# Patient Record
Sex: Male | Born: 1985
Health system: Southern US, Community
[De-identification: ages and names within clinical notes are randomized; demographics above are authoritative.]

## PROBLEM LIST (undated history)

## (undated) DIAGNOSIS — I10 Essential (primary) hypertension: Secondary | ICD-10-CM

## (undated) DIAGNOSIS — E119 Type 2 diabetes mellitus without complications: Secondary | ICD-10-CM

## (undated) DIAGNOSIS — M549 Dorsalgia, unspecified: Secondary | ICD-10-CM

## (undated) HISTORY — PX: HAND SURGERY: SHX662

---

## 2006-09-02 ENCOUNTER — Emergency Department (HOSPITAL_COMMUNITY): Admission: EM | Admit: 2006-09-02 | Discharge: 2006-09-02 | Payer: Self-pay | Admitting: Emergency Medicine

## 2006-09-16 ENCOUNTER — Emergency Department (HOSPITAL_COMMUNITY): Admission: EM | Admit: 2006-09-16 | Discharge: 2006-09-17 | Payer: Self-pay | Admitting: Emergency Medicine

## 2012-06-06 ENCOUNTER — Encounter (HOSPITAL_COMMUNITY): Payer: Self-pay | Admitting: *Deleted

## 2012-06-06 ENCOUNTER — Emergency Department (HOSPITAL_COMMUNITY)
Admission: EM | Admit: 2012-06-06 | Discharge: 2012-06-06 | Disposition: A | Discharge status: Home / Self Care | Payer: Self-pay | Attending: Emergency Medicine | Admitting: Emergency Medicine

## 2012-06-06 DIAGNOSIS — Z8739 Personal history of other diseases of the musculoskeletal system and connective tissue: Secondary | ICD-10-CM | POA: Insufficient documentation

## 2012-06-06 DIAGNOSIS — I10 Essential (primary) hypertension: Secondary | ICD-10-CM | POA: Insufficient documentation

## 2012-06-06 DIAGNOSIS — B853 Phthiriasis: Secondary | ICD-10-CM | POA: Insufficient documentation

## 2012-06-06 DIAGNOSIS — F17200 Nicotine dependence, unspecified, uncomplicated: Secondary | ICD-10-CM | POA: Insufficient documentation

## 2012-06-06 HISTORY — DX: Essential (primary) hypertension: I10

## 2012-06-06 HISTORY — DX: Dorsalgia, unspecified: M54.9

## 2012-06-06 MED ORDER — PERMETHRIN 0.5 % AERO: INHALATION_SPRAY | Freq: Once | Status: DC

## 2012-06-06 NOTE — ED Provider Notes (Signed)
Medical screening examination/treatment/procedure(s) were performed by non-physician practitioner and as supervising physician I was immediately available for consultation/collaboration.    Sandeep Radell R Marylu Dudenhoeffer, MD 06/06/12 1558 

## 2012-06-06 NOTE — ED Provider Notes (Signed)
History     CSN: [id number]  Arrival date & time 06/06/12  1244   First MD Initiated Contact with Patient 06/06/12 1459      Chief Complaint  Patient presents with  . SEXUALLY TRANSMITTED DISEASE    (Consider location/radiation/quality/duration/timing/severity/associated sxs/prior treatment) HPI Patient presents emergency department with itching of his penis and scrotal area.  Patient, states he had sexual intercourse with his ex-wife in it started 2 days after this encounter.  Patient denies penile discharge, fever, dysuria, nausea, vomiting, or, abdominal pain.  Patient, states he did not take any medications prior to arrival for his symptoms.  Patient, states, that he's never had any sexually transmitted diseases. Past Medical History  Diagnosis Date  . Hypertension   . Back pain     History reviewed. No pertinent past surgical history.  No family history on file.  History  Substance Use Topics  . Smoking status: Current Every Day Smoker  . Smokeless tobacco: Not on file  . Alcohol Use: Yes      Review of Systems All other systems negative except as documented in the HPI. All pertinent positives and negatives as reviewed in the HPI. Allergies  Review of patient's allergies indicates no known allergies.  Home Medications  No current outpatient prescriptions on file.  BP 141/78  Pulse 80  Temp(Src) 98.5 F (36.9 C) (Oral)  Resp 16  Wt 220 lb (99.791 kg)  SpO2 100%  Physical Exam  Constitutional: He appears well-developed and well-nourished. No distress.  Neck: Normal range of motion. Neck supple.  Genitourinary:    Right testis shows no mass, no swelling and no tenderness. Left testis shows no mass, no swelling and no tenderness. Penile erythema present. No discharge found.  Skin: Skin is warm and dry.    ED Course  Procedures (including critical care time) Patient be treated for possible pubic lice.  He is advised to return here as needed.  There is  no penile discharge noted.  On exam.  The patient denies any discharge.    MDM          Carlyle Dolly, PA-C 06/06/12 1520

## 2012-06-06 NOTE — ED Notes (Signed)
Reports 2 days of itching, denies any other symptoms or urinary problems

## 2017-05-10 ENCOUNTER — Inpatient Hospital Stay (HOSPITAL_COMMUNITY)
Admission: EM | Admit: 2017-05-10 | Discharge: 2017-05-12 | DRG: 639 | Disposition: A | Discharge status: Home / Self Care | Payer: Self-pay | Attending: Internal Medicine | Admitting: Internal Medicine

## 2017-05-10 ENCOUNTER — Other Ambulatory Visit: Payer: Self-pay

## 2017-05-10 ENCOUNTER — Encounter (HOSPITAL_COMMUNITY): Payer: Self-pay | Admitting: *Deleted

## 2017-05-10 DIAGNOSIS — E875 Hyperkalemia: Secondary | ICD-10-CM | POA: Diagnosis present

## 2017-05-10 DIAGNOSIS — Z8679 Personal history of other diseases of the circulatory system: Secondary | ICD-10-CM

## 2017-05-10 DIAGNOSIS — R402413 Glasgow coma scale score 13-15, at hospital admission: Secondary | ICD-10-CM | POA: Diagnosis present

## 2017-05-10 DIAGNOSIS — R9431 Abnormal electrocardiogram [ECG] [EKG]: Secondary | ICD-10-CM | POA: Diagnosis present

## 2017-05-10 DIAGNOSIS — R945 Abnormal results of liver function studies: Secondary | ICD-10-CM

## 2017-05-10 DIAGNOSIS — D72829 Elevated white blood cell count, unspecified: Secondary | ICD-10-CM | POA: Diagnosis present

## 2017-05-10 DIAGNOSIS — R000 Tachycardia, unspecified: Secondary | ICD-10-CM | POA: Diagnosis present

## 2017-05-10 DIAGNOSIS — R7989 Other specified abnormal findings of blood chemistry: Secondary | ICD-10-CM | POA: Diagnosis present

## 2017-05-10 DIAGNOSIS — E1110 Type 2 diabetes mellitus with ketoacidosis without coma: Principal | ICD-10-CM | POA: Diagnosis present

## 2017-05-10 DIAGNOSIS — F17200 Nicotine dependence, unspecified, uncomplicated: Secondary | ICD-10-CM | POA: Diagnosis present

## 2017-05-10 DIAGNOSIS — K760 Fatty (change of) liver, not elsewhere classified: Secondary | ICD-10-CM | POA: Diagnosis present

## 2017-05-10 DIAGNOSIS — M549 Dorsalgia, unspecified: Secondary | ICD-10-CM | POA: Diagnosis present

## 2017-05-10 LAB — URINALYSIS, ROUTINE W REFLEX MICROSCOPIC
BACTERIA UA: NONE SEEN
BILIRUBIN URINE: NEGATIVE
Glucose, UA: >=500 mg/dL — AB
KETONES UR: 80 mg/dL — AB
Leukocytes, UA: NEGATIVE
NITRITE: NEGATIVE
PROTEIN: 100 mg/dL — AB
Specific Gravity, Urine: 1.027 (ref 1.005–1.030)
pH: 5 (ref 5.0–8.0)

## 2017-05-10 LAB — BLOOD GAS, VENOUS
Acid-base deficit: 17.5 mmol/L — ABNORMAL HIGH (ref 0.0–2.0)
Bicarbonate: 9.9 mmol/L — ABNORMAL LOW (ref 20.0–28.0)
O2 Saturation: 79.3 %
PATIENT TEMPERATURE: 98.6
PH VEN: 7.191 — AB (ref 7.250–7.430)
pCO2, Ven: 26.9 mmHg — ABNORMAL LOW (ref 44.0–60.0)
pO2, Ven: 50.1 mmHg — ABNORMAL HIGH (ref 32.0–45.0)

## 2017-05-10 LAB — CBC
HEMATOCRIT: 52.4 % — AB (ref 39.0–52.0)
HEMATOCRIT: 49.6 % (ref 39.0–52.0)
Hemoglobin: 17.8 g/dL — ABNORMAL HIGH (ref 13.0–17.0)
Hemoglobin: 17.4 g/dL — ABNORMAL HIGH (ref 13.0–17.0)
MCH: 30 pg (ref 26.0–34.0)
MCH: 30.3 pg (ref 26.0–34.0)
MCHC: 34 g/dL (ref 30.0–36.0)
MCHC: 35.1 g/dL (ref 30.0–36.0)
MCV: 88.4 fL (ref 78.0–100.0)
MCV: 86.4 fL (ref 78.0–100.0)
Platelets: 367 10*3/uL (ref 150–400)
Platelets: 368 10*3/uL (ref 150–400)
RBC: 5.93 MIL/uL — AB (ref 4.22–5.81)
RBC: 5.74 MIL/uL (ref 4.22–5.81)
RDW: 13.6 % (ref 11.5–15.5)
RDW: 13.7 % (ref 11.5–15.5)
WBC: 13.6 10*3/uL — ABNORMAL HIGH (ref 4.0–10.5)
WBC: 14.1 10*3/uL — ABNORMAL HIGH (ref 4.0–10.5)

## 2017-05-10 LAB — BASIC METABOLIC PANEL
ANION GAP: 15 (ref 5–15)
Anion gap: 20 — ABNORMAL HIGH (ref 5–15)
BUN: 18 mg/dL (ref 6–20)
BUN: 17 mg/dL (ref 6–20)
CHLORIDE: 109 mmol/L (ref 101–111)
CO2: 10 mmol/L — AB (ref 22–32)
CO2: 15 mmol/L — AB (ref 22–32)
Calcium: 9.1 mg/dL (ref 8.9–10.3)
Calcium: 9.2 mg/dL (ref 8.9–10.3)
Chloride: 111 mmol/L (ref 101–111)
Creatinine, Ser: 1.16 mg/dL (ref 0.61–1.24)
Creatinine, Ser: 1.02 mg/dL (ref 0.61–1.24)
GFR calc non Af Amer: >60 mL/min (ref >60)
GFR calc non Af Amer: >60 mL/min (ref >60)
Glucose, Bld: 350 mg/dL — ABNORMAL HIGH (ref 65–99)
Glucose, Bld: 256 mg/dL — ABNORMAL HIGH (ref 65–99)
Potassium: 4.2 mmol/L (ref 3.5–5.1)
Potassium: 4 mmol/L (ref 3.5–5.1)
Sodium: 139 mmol/L (ref 135–145)
Sodium: 141 mmol/L (ref 135–145)

## 2017-05-10 LAB — CBG MONITORING, ED
GLUCOSE-CAPILLARY: 551 mg/dL — AB (ref 65–99)
GLUCOSE-CAPILLARY: 349 mg/dL — AB (ref 65–99)
Glucose-Capillary: 467 mg/dL — ABNORMAL HIGH (ref 65–99)

## 2017-05-10 LAB — HEMOGLOBIN A1C
Hgb A1c MFr Bld: 13.8 % — ABNORMAL HIGH (ref 4.8–5.6)
Mean Plasma Glucose: 349.36 mg/dL

## 2017-05-10 LAB — LIPASE, BLOOD: Lipase: 94 U/L — ABNORMAL HIGH (ref 11–51)

## 2017-05-10 LAB — COMPREHENSIVE METABOLIC PANEL
ALBUMIN: 4 g/dL (ref 3.5–5.0)
ALK PHOS: 138 U/L — AB (ref 38–126)
ALT: 106 U/L — ABNORMAL HIGH (ref 17–63)
AST: 50 U/L — ABNORMAL HIGH (ref 15–41)
Anion gap: 28 — ABNORMAL HIGH (ref 5–15)
BUN: 21 mg/dL — ABNORMAL HIGH (ref 6–20)
CALCIUM: 10.3 mg/dL (ref 8.9–10.3)
CO2: 12 mmol/L — AB (ref 22–32)
Chloride: 90 mmol/L — ABNORMAL LOW (ref 101–111)
Creatinine, Ser: 1.53 mg/dL — ABNORMAL HIGH (ref 0.61–1.24)
GFR calc non Af Amer: 60 mL/min — ABNORMAL LOW (ref >60)
GLUCOSE: 807 mg/dL — AB (ref 65–99)
POTASSIUM: 5.7 mmol/L — AB (ref 3.5–5.1)
SODIUM: 130 mmol/L — AB (ref 135–145)
TOTAL PROTEIN: 7.9 g/dL (ref 6.5–8.1)
Total Bilirubin: UNDETERMINED mg/dL (ref 0.3–1.2)

## 2017-05-10 LAB — GLUCOSE, CAPILLARY
GLUCOSE-CAPILLARY: 268 mg/dL — AB (ref 65–99)
Glucose-Capillary: 320 mg/dL — ABNORMAL HIGH (ref 65–99)
Glucose-Capillary: 242 mg/dL — ABNORMAL HIGH (ref 65–99)

## 2017-05-10 LAB — MRSA PCR SCREENING: MRSA by PCR: NEGATIVE

## 2017-05-10 MED ORDER — DEXTROSE 50 % IV SOLN: 25.0000 mL | INTRAVENOUS | Status: DC | PRN

## 2017-05-10 MED ORDER — INSULIN REGULAR BOLUS VIA INFUSION
0.0000 [IU] | Freq: Three times a day (TID) | INTRAVENOUS | Status: AC
  Administered 2017-05-11: 7.3 [IU] via INTRAVENOUS
  Filled 2017-05-10: qty 10

## 2017-05-10 MED ORDER — SODIUM CHLORIDE 0.9 % IV SOLN
INTRAVENOUS | Status: DC
  Administered 2017-05-10: 19:00:00 via INTRAVENOUS

## 2017-05-10 MED ORDER — SODIUM CHLORIDE 0.9 % IV BOLUS
1000.0000 mL | Freq: Once | INTRAVENOUS | Status: AC
  Administered 2017-05-10: 1000 mL via INTRAVENOUS

## 2017-05-10 MED ORDER — DEXTROSE-NACL 5-0.45 % IV SOLN: INTRAVENOUS | Status: DC

## 2017-05-10 MED ORDER — ONDANSETRON HCL 4 MG/2ML IJ SOLN: 4.0000 mg | Freq: Once | INTRAMUSCULAR | Status: DC

## 2017-05-10 MED ORDER — DEXTROSE-NACL 5-0.45 % IV SOLN
INTRAVENOUS | Status: DC
  Administered 2017-05-10 – 2017-05-11 (×2): via INTRAVENOUS

## 2017-05-10 MED ORDER — HEPARIN SODIUM (PORCINE) 5000 UNIT/ML IJ SOLN
5000.0000 [IU] | Freq: Three times a day (TID) | INTRAMUSCULAR | Status: DC
  Administered 2017-05-10 – 2017-05-12 (×5): 5000 [IU] via SUBCUTANEOUS
  Filled 2017-05-10 (×5): qty 1

## 2017-05-10 MED ORDER — SODIUM CHLORIDE 0.9 % IV SOLN
INTRAVENOUS | Status: DC
  Administered 2017-05-11: 12.6 [IU]/h via INTRAVENOUS
  Filled 2017-05-10: qty 1

## 2017-05-10 MED ORDER — SODIUM CHLORIDE 0.9 % IV SOLN
INTRAVENOUS | Status: DC
  Administered 2017-05-10: 5.4 [IU]/h via INTRAVENOUS
  Filled 2017-05-10: qty 1

## 2017-05-10 NOTE — ED Provider Notes (Addendum)
[facility]-EMERGENCY DEPT Provider Note   CSN: [id number] Arrival date & time: 05/10/17  1155     History   Chief Complaint Chief Complaint  Patient presents with  . Emesis    HPI Robert Ballard is a 33 y.o. male.  HPI   Robert Ballard is a 32yo male with no significant past medical history who presents to the emergency department for evaluation of polydipsia, polyuria, fatigue and vomiting.  Patient reports that he has felt generally unwell for the past week now.  States that he is not able to stay hydrated and feels like his mouth is always dry.  He is been going to the bathroom almost every hour, denies dysuria or hematuria.  States that he is been feeling generally tired and weak.  Has had vomiting for the past 3 days, nonbilious.  Denies history of diabetes.  He does not see a PCP regularly.  Has been diagnosed with hypertension in the past, but does not take any antihypertensives.  He denies fever, chills, chest pain, shortness of breath, abdominal pain, diarrhea, lightheadedness or syncope.  Past Medical History:  Diagnosis Date  . Back pain   . Hypertension     There are no active problems to display for this patient.   No past surgical history on file.      Home Medications    Prior to Admission medications   Not on File    Family History No family history on file.  Social History Social History   Tobacco Use  . Smoking status: Current Every Day Smoker  Substance Use Topics  . Alcohol use: Yes  . Drug use: No     Allergies   Patient has no known allergies.   Review of Systems Review of Systems  Constitutional: Positive for fatigue. Negative for chills and fever.  HENT: Negative for congestion and rhinorrhea.   Respiratory: Negative for shortness of breath.   Cardiovascular: Negative for chest pain.  Gastrointestinal: Positive for nausea and vomiting. Negative for abdominal pain, blood in stool and  diarrhea.  Endocrine: Positive for polydipsia and polyuria.  Genitourinary: Negative for difficulty urinating, dysuria and hematuria.  Musculoskeletal: Negative for back pain.  Skin: Negative for rash.  Neurological: Positive for weakness (generally feels weak). Negative for light-headedness, numbness and headaches.  Psychiatric/Behavioral: Negative for agitation.     Physical Exam Updated Vital Signs BP (!) 167/108 (BP Location: Left Arm)   Pulse (!) 113   Temp 98.4 F (36.9 C) (Oral)   Resp 17   Ht  (1.727 m)   Wt 95.3 kg (210 lb)   SpO2 98%   BMI 31.93 kg/m   Physical Exam  Constitutional: He is oriented to person, place, and time. He appears well-developed and well-nourished. No distress.  Sitting at bedside, non-toxic.  HENT:  Head: Normocephalic and atraumatic.  Mucous membranes dry.  Eyes: Pupils are equal, round, and reactive to light. Conjunctivae are normal. Right eye exhibits no discharge. Left eye exhibits no discharge.  Neck: Normal range of motion. Neck supple.  Cardiovascular: Intact distal pulses.  No murmur heard. Tachycardic, regular rhythm.  Pulmonary/Chest: Effort normal and breath sounds normal. No stridor. No respiratory distress. He has no wheezes. He has no rales.  No respiratory distress.  No Kussmaul breathing.  Lungs clear to auscultation.   Abdominal:  Abdomen soft and nondistended.  Nontender to palpation.  Musculoskeletal: Normal range of motion.  Neurological: He is alert and oriented to person, place,  and time. Coordination normal.  Skin: Skin is warm and dry. He is not diaphoretic.  Psychiatric: He has a normal mood and affect. His behavior is normal.  Nursing note and vitals reviewed.    ED Treatments / Results  Labs (all labs ordered are listed, but only abnormal results are displayed) Labs Reviewed  LIPASE, BLOOD - Abnormal; Notable for the following components:      Result Value   Lipase 94 (*)    All other components  within normal limits  COMPREHENSIVE METABOLIC PANEL - Abnormal; Notable for the following components:   Sodium 130 (*)    Potassium 5.7 (*)    Chloride 90 (*)    CO2 12 (*)    Glucose, Bld 807 (*)    BUN 21 (*)    Creatinine, Ser 1.53 (*)    AST 50 (*)    ALT 106 (*)    Alkaline Phosphatase 138 (*)    GFR calc non Af Amer 60 (*)    Anion gap 28.0 (*)    All other components within normal limits  CBC - Abnormal; Notable for the following components:   WBC 13.6 (*)    RBC 5.93 (*)    Hemoglobin 17.8 (*)    HCT 52.4 (*)    All other components within normal limits  URINALYSIS, ROUTINE W REFLEX MICROSCOPIC - Abnormal; Notable for the following components:   Color, Urine STRAW (*)    Glucose, UA >=500 (*)    Hgb urine dipstick MODERATE (*)    Ketones, ur 80 (*)    Protein, ur 100 (*)    All other components within normal limits  BLOOD GAS, VENOUS - Abnormal; Notable for the following components:   pH, Ven 7.191 (*)    pCO2, Ven 26.9 (*)    pO2, Ven 50.1 (*)    Bicarbonate 9.9 (*)    Acid-base deficit 17.5 (*)    All other components within normal limits  CBG MONITORING, ED - Abnormal; Notable for the following components:   Glucose-Capillary >600 (*)    All other components within normal limits  CBG MONITORING, ED - Abnormal; Notable for the following components:   Glucose-Capillary 551 (*)    All other components within normal limits  CBG MONITORING, ED - Abnormal; Notable for the following components:   Glucose-Capillary 467 (*)    All other components within normal limits  HEMOGLOBIN A1C    EKG EKG Interpretation  Date/Time:  Saturday May 10 2017 12:45:42 EDT Ventricular Rate:  111 PR Interval:    QRS Duration: 83 QT Interval:  306 QTC Calculation: 416 R Axis:   54 Text Interpretation:  Sinus tachycardia Right atrial enlargement Abnormal T, consider ischemia, diffuse leads No old tracing to compare Confirmed by Lorre Nick ([id number]) on 05/10/2017 1:17:58  PM   Radiology No results found.  Procedures Procedures (including critical care time)  CRITICAL CARE Performed by: Kellie Shropshire   Total critical care time: 35 minutes  Critical care time was exclusive of separately billable procedures and treating other patients.  Critical care was necessary to treat or prevent imminent or life-threatening deterioration.  Critical care was time spent personally by me on the following activities: development of treatment plan with patient and/or surrogate as well as nursing, discussions with consultants, evaluation of patient's response to treatment, examination of patient, obtaining history from patient or surrogate, ordering and performing treatments and interventions, ordering and review of laboratory studies, ordering and review of radiographic  studies, pulse oximetry and re-evaluation of patient's condition.   Medications Ordered in ED Medications  insulin regular (NOVOLIN R,HUMULIN R) 100 Units in sodium chloride 0.9 % 100 mL (1 Units/mL) infusion (9.8 Units/hr Intravenous Rate/Dose Change 05/10/17 1550)  ondansetron (ZOFRAN) injection 4 mg (has no administration in time range)  dextrose 5 %-0.45 % sodium chloride infusion (has no administration in time range)  sodium chloride 0.9 % bolus 1,000 mL (0 mLs Intravenous Stopped 05/10/17 1432)  sodium chloride 0.9 % bolus 1,000 mL (0 mLs Intravenous Stopped 05/10/17 1432)  sodium chloride 0.9 % bolus 1,000 mL (0 mLs Intravenous Stopped 05/10/17 1502)     Initial Impression / Assessment and Plan / ED Course  I have reviewed the triage vital signs and the nursing notes.  Pertinent labs & imaging results that were available during my care of the patient were reviewed by me and considered in my medical decision making (see chart for details).    Patient without prior history of diabetes presents to the emergency department for evaluation of polydipsia, polyuria, vomiting and fatigue.  On exam,  mucous memories dry.  No abdominal tenderness.  Lungs clear to auscultation, no respiratory distress.  Lab work consistent with DKA.  Patient's glucose is 807 on arrival with anion gap of 28.  VBG reveals acidosis with pH 7.191, bicarb low 9.9 and pCO2 low 26.9. Consistent with DKA.  CMP reveals elevated creatinine 1.53, no baseline to compare to.  This is likely prerenal in the setting of dehydration.  He also has a leukocytosis with WBC count 13.6, mild elevation in liver enzymes (AST 50, ALT 106).  He also has an elevated lipase of 94.  Given no abdominal tenderness on exam, no fever do not think CT imaging is indicated at this time.  Discussed this patient with Dr. Freida Busman who also saw the patient and agrees with this plan.  Leukocytosis is likely reactive from DKA.  UA without evidence of infection, reveals ketones and glucose, consistent with DKA.  Patient given IV fluids and insulin per DKA protocol.  Discussed this patient with Hospitalist Dr. Cena Benton who will admit this patient. Patient informed.  This was a shared visit with Dr. Freida Busman who also saw the patient and agrees with the above plan and admission.    Final Clinical Impressions(s) / ED Diagnoses   Final diagnoses:  None    ED Discharge Orders    None       Kellie Shropshire, PA-C 05/10/17 1659    Kellie Shropshire, PA-C 05/10/17 1705

## 2017-05-10 NOTE — ED Provider Notes (Signed)
Medical screening examination/treatment/procedure(s) were conducted as a shared visit with non-physician practitioner(s) and myself.  I personally evaluated the patient during the encounter.  EKG Interpretation  Date/Time:  Saturday May 10 2017 12:45:42 EDT Ventricular Rate:  111 PR Interval:    QRS Duration: 83 QT Interval:  306 QTC Calculation: 416 R Axis:   54 Text Interpretation:  Sinus tachycardia Right atrial enlargement Abnormal T, consider ischemia, diffuse leads No old tracing to compare Confirmed by Lorre Nick ([id number]) on 05/10/2017 1:54:100 PM  32 year old male presents with several days of polyuria and polydipsia with associated weight loss.  Patient's lites lites show evidence of DKA.  Patient given IV fluids here as well as started on glucose stabilizer and will be admitted to the hospital   Lorre Nick, MD 05/10/17 1423

## 2017-05-10 NOTE — H&P (Signed)
History and Physical    Robert Ballard ZOX:[id number] DOB: [birth year] DOA: 05/10/2017  PCP: Patient, No Pcp Per  Patient coming from: home  Chief Complaint: nausea and dizziness  HPI: Robert Ballard is a 32 y.o. male with medical history significant of patient was generally healthy but presented to the hospital after having persistent nausea and dizziness.  Patient also reports polyuria and polydipsia.  He had no prior history of diabetes.  The problem has been persistent and was not getting better but was actually getting worse as such patient presented for further evaluation recommendations.  He denies being around any sick contacts.  Nothing he is aware of made it better or worse.  ED Course: In the ED patient was found to have elevated blood sugar levels of 807, elevated potassium level of 5.7, with leukocytosis of 13.6 and CO2 of 12.  We were subsequently consulted for management of DKA.  Review of Systems: As per HPI otherwise 10 point review of systems negative.    Past Medical History:  Diagnosis Date  . Back pain   . Hypertension     No past surgical history on file.   reports that he has been smoking.  He does not have any smokeless tobacco history on file. He reports that he drinks alcohol. He reports that he does not use drugs.  No Known Allergies  FAmily history - pt unsure of family history of diseases states he has lived her since he was 46 and would not know   Prior to Admission medications   Not on File    Physical Exam: Vitals:   05/10/17 1201 05/10/17 1207 05/10/17 1247 05/10/17 1440  BP:  (!) 159/98 (!) 167/108 (!) 172/98  Pulse:  (!) 116 (!) 113 (!) 106  Resp:  18 17   Temp:  98.4 F (36.9 C)    TempSrc:  Oral    SpO2: 97% 98% 98% 99%  Weight:  95.3 kg (210 lb)    Height:   (1.727 m)      Constitutional: NAD, calm, comfortable Vitals:   05/10/17 1201 05/10/17 1207 05/10/17 1247 05/10/17 1440  BP:  (!) 159/98 (!)  167/108 (!) 172/98  Pulse:  (!) 116 (!) 113 (!) 106  Resp:  18 17   Temp:  98.4 F (36.9 C)    TempSrc:  Oral    SpO2: 97% 98% 98% 99%  Weight:  95.3 kg (210 lb)    Height:   (1.727 m)     Eyes: PERRL, lids and conjunctivae normal ENMT: Mucous membranes are moist. Posterior pharynx clear of any exudate or lesions.Normal dentition.  Neck: normal, supple, no masses, no thyromegaly Respiratory: clear to auscultation bilaterally, no wheezing, no crackles. Normal respiratory effort. No accessory muscle use.  Cardiovascular: Regular rate and rhythm, no murmurs / rubs / gallops. No extremity edema. 2+ pedal pulses. No carotid bruits.  Abdomen: no tenderness, no masses palpated. No hepatosplenomegaly. Bowel sounds positive.  Musculoskeletal: no clubbing / cyanosis. No joint deformity upper and lower extremities. Good ROM, no contractures. Normal muscle tone.  Skin: no rashes, lesions, ulcers. No induration Neurologic: No facial asymmetry, answers questions appropriately, sensation intact,Strength 5/5 in all 4.  Psychiatric: Normal judgment and insight. Alert and oriented x 3. Normal mood.    Labs on Admission: I have personally reviewed following labs and imaging studies  CBC: Recent Labs  Lab 05/10/17 1246  WBC 13.6*  HGB 17.8*  HCT 52.4*  MCV 88.4  PLT 367   Basic Metabolic Panel: Recent Labs  Lab 05/10/17 1246  NA 130*  K 5.7*  CL 90*  CO2 12*  GLUCOSE 807*  BUN 21*  CREATININE 1.53*  CALCIUM 10.3   GFR: Estimated Creatinine Clearance: 79.1 mL/min (A) (by C-G formula based on SCr of 1.53 mg/dL (H)). Liver Function Tests: Recent Labs  Lab 05/10/17 1246  AST 50*  ALT 106*  ALKPHOS 138*  BILITOT UNABLE TO REPORT DUE TO LIPEMIC INTERFERENCE  PROT 7.9  ALBUMIN 4.0   Recent Labs  Lab 05/10/17 1246  LIPASE 94*   No results for input(s): AMMONIA in the last 168 hours. Coagulation Profile: No results for input(s): INR, PROTIME in the last 168 hours. Cardiac  Enzymes: No results for input(s): CKTOTAL, CKMB, CKMBINDEX, TROPONINI in the last 168 hours. BNP (last 3 results) No results for input(s): PROBNP in the last 8760 hours. HbA1C: No results for input(s): HGBA1C in the last 72 hours. CBG: Recent Labs  Lab 05/10/17 1241 05/10/17 1547  GLUCAP >600* 551*   Lipid Profile: No results for input(s): CHOL, HDL, LDLCALC, TRIG, CHOLHDL, LDLDIRECT in the last 72 hours. Thyroid Function Tests: No results for input(s): TSH, T4TOTAL, FREET4, T3FREE, THYROIDAB in the last 72 hours. Anemia Panel: No results for input(s): VITAMINB12, FOLATE, FERRITIN, TIBC, IRON, RETICCTPCT in the last 72 hours. Urine analysis:    Component Value Date/Time   COLORURINE STRAW (A) 05/10/2017 1246   APPEARANCEUR CLEAR 05/10/2017 1246   LABSPEC 1.027 05/10/2017 1246   PHURINE 5.0 05/10/2017 1246   GLUCOSEU >=500 (A) 05/10/2017 1246   HGBUR MODERATE (A) 05/10/2017 1246   BILIRUBINUR NEGATIVE 05/10/2017 1246   KETONESUR 80 (A) 05/10/2017 1246   PROTEINUR 100 (A) 05/10/2017 1246   NITRITE NEGATIVE 05/10/2017 1246   LEUKOCYTESUR NEGATIVE 05/10/2017 1246    Radiological Exams on Admission: No results found.  EKG: Independently reviewed.  Sinus tachycardia with T wave inversions  Assessment/Plan Active Problems:   DKA (diabetic ketoacidoses) (HCC) -Patient is new diabetic.  He was not on medications as outpatient.  As such we will obtain an hemoglobin A1c to help guide treatment moving forward. -For now we will place patient on insulin drip and transition to stepdown unit for close monitoring. -Have placed order set for glucose stabilizer -Serial BMPs to monitor acidosis  T wave inversions - Patient complains of no chest discomfort -Obtain EKG next a.m. to see if changes are secondary to metabolic abnormalities.   DVT prophylaxis: heparin Code Status: full Family Communication: Discussed with patient directly Disposition Plan: With resolution of acidosis  and safe discharge plan for treatment of diabetes with forward Consults called: none Admission status: inpatient   Penny Pia MD [facility] Pager [phone]  If 7PM-7AM, please contact night-coverage [email or web address] Password [facility]  05/10/2017, 3:56 PM

## 2017-05-10 NOTE — ED Notes (Signed)
ED TO INPATIENT HANDOFF REPORT  Name/Age/Gender Robert Ballard 32 y.o. male  Code Status   Home/SNF/Other Home  Chief Complaint vomiting  Level of Care/Admitting Diagnosis ED Disposition    ED Disposition Condition [place]: [place] [100102]  Level of Care: Stepdown [14]  Admit to SDU based on following criteria: Hemodynamic compromise or significant risk of instability:  Patient requiring short term acute titration and management of vasoactive drips, and invasive monitoring (i.e., CVP and Arterial line).  Diagnosis: DKA (diabetic ketoacidoses) [facility]) [[id number]]  Admitting Physician: Velvet Bathe [phone]  Attending Physician: Velvet Bathe [phone]  Estimated length of stay: past midnight tomorrow  Certification:: I certify this patient will need inpatient services for at least 2 midnights  PT Class (Do Not Modify): Inpatient [101]  PT Acc Code (Do Not Modify): Private [1]       Medical History Past Medical History:  Diagnosis Date  . Back pain   . Hypertension     Allergies No Known Allergies  IV Location/Drains/Wounds Patient Lines/Drains/Airways Status   Active Line/Drains/Airways    Name:   Placement date:   Placement time:   Site:   Days:   Peripheral IV 05/10/17 Left Antecubital   05/10/17    1201    Antecubital   less than 1          Labs/Imaging Results for orders placed or performed during the hospital encounter of 05/10/17 (from the past 48 hour(s))  CBG monitoring, ED     Status: Abnormal   Collection Time: 05/10/17 12:41 PM  Result Value Ref Range   Glucose-Capillary >600 (HH) 65 - 99 mg/dL  Lipase, blood     Status: Abnormal   Collection Time: 05/10/17 12:46 PM  Result Value Ref Range   Lipase 94 (H) 11 - 51 U/L    Comment: Performed at [facility], [place] [address] [place], [place] [address]  Comprehensive metabolic panel     Status: Abnormal   Collection Time:  05/10/17 12:46 PM  Result Value Ref Range   Sodium 130 (L) 135 - 145 mmol/L   Potassium 5.7 (H) 3.5 - 5.1 mmol/L   Chloride 90 (L) 101 - 111 mmol/L   CO2 12 (L) 22 - 32 mmol/L   Glucose, Bld 807 (HH) 65 - 99 mg/dL    Comment: CRITICAL RESULT CALLED TO, READ BACK BY AND VERIFIED WITH: P.DOWD RN 05/10/2017 1354 JR    BUN 21 (H) 6 - 20 mg/dL   Creatinine, Ser 1.53 (H) 0.61 - 1.24 mg/dL   Calcium 10.3 8.9 - 10.3 mg/dL   Total Protein 7.9 6.5 - 8.1 g/dL   Albumin 4.0 3.5 - 5.0 g/dL   AST 50 (H) 15 - 41 U/L   ALT 106 (H) 17 - 63 U/L   Alkaline Phosphatase 138 (H) 38 - 126 U/L   Total Bilirubin UNABLE TO REPORT DUE TO LIPEMIC INTERFERENCE 0.3 - 1.2 mg/dL    Comment: NOTIFIED P.DOWD RN 05/10/2017 1422 JR   GFR calc non Af Amer 60 (L) >60 mL/min   GFR calc Af Amer >60 >60 mL/min    Comment: (NOTE) The eGFR has been calculated using the CKD EPI equation. This calculation has not been validated in all clinical situations. eGFR's persistently <60 mL/min signify possible Chronic Kidney Disease.    Anion gap 28.0 (H) 5 - 15    Comment: Performed at [facility], [place] [address] [place],  [place] [address]  CBC     Status: Abnormal   Collection Time: 05/10/17 12:46 PM  Result Value Ref Range   WBC 13.6 (H) 4.0 - 10.5 K/uL   RBC 5.93 (H) 4.22 - 5.81 MIL/uL   Hemoglobin 17.8 (H) 13.0 - 17.0 g/dL   HCT 52.4 (H) 39.0 - 52.0 %   MCV 88.4 78.0 - 100.0 fL   MCH 30.0 26.0 - 34.0 pg   MCHC 34.0 30.0 - 36.0 g/dL   RDW 13.6 11.5 - 15.5 %   Platelets 367 150 - 400 K/uL    Comment: Performed at [facility], [place] [address], [place], [place] [address]  Urinalysis, Routine w reflex microscopic     Status: Abnormal   Collection Time: 05/10/17 12:46 PM  Result Value Ref Range   Color, Urine STRAW (A) YELLOW   APPearance CLEAR CLEAR   Specific Gravity, Urine 1.027 1.005 - 1.030   pH 5.0 5.0 - 8.0   Glucose, UA >=500 (A) NEGATIVE mg/dL   Hgb urine dipstick  MODERATE (A) NEGATIVE   Bilirubin Urine NEGATIVE NEGATIVE   Ketones, ur 80 (A) NEGATIVE mg/dL   Protein, ur 100 (A) NEGATIVE mg/dL   Nitrite NEGATIVE NEGATIVE   Leukocytes, UA NEGATIVE NEGATIVE   RBC / HPF 0-5 0 - 5 RBC/hpf   WBC, UA 0-5 0 - 5 WBC/hpf   Bacteria, UA NONE SEEN NONE SEEN    Comment: Performed at [facility], [place] [address], [place], [place] [address]  Blood gas, venous     Status: Abnormal   Collection Time: 05/10/17  2:45 PM  Result Value Ref Range   pH, Ven 7.191 (LL) 7.250 - 7.430    Comment: RVB RBV DR SHROSBREE,MD AT 1455 BY LISA CRADDOCK,RRT,RCP ON 05/10/17    pCO2, Ven 26.9 (L) 44.0 - 60.0 mmHg   pO2, Ven 50.1 (H) 32.0 - 45.0 mmHg   Bicarbonate 9.9 (L) 20.0 - 28.0 mmol/L   Acid-base deficit 17.5 (H) 0.0 - 2.0 mmol/L   O2 Saturation 79.3 %   Patient temperature 98.6    Collection site VEIN    Drawn by COLLECTED BY LABORATORY    Sample type VEIN     Comment: Performed at [facility], [place] [address] [place], [place] [address]  CBG monitoring, ED     Status: Abnormal   Collection Time: 05/10/17  3:47 PM  Result Value Ref Range   Glucose-Capillary 551 (HH) 65 - 99 mg/dL   Comment 1 Document in Chart   CBG monitoring, ED     Status: Abnormal   Collection Time: 05/10/17  4:44 PM  Result Value Ref Range   Glucose-Capillary 467 (H) 65 - 99 mg/dL  CBG monitoring, ED     Status: Abnormal   Collection Time: 05/10/17  5:49 PM  Result Value Ref Range   Glucose-Capillary 349 (H) 65 - 99 mg/dL   No results found.  Pending Labs Unresulted Labs (From admission, onward)   Start     Ordered   05/10/17 1559  Hemoglobin A1c  Once,   R     05/10/17 1558   Signed and Held  HIV antibody (Routine Testing)  Once,   R     Signed and Held   Signed and Held  CBC  (heparin)  Once,   R    Comments:  Baseline for heparin therapy IF NOT ALREADY DRAWN.  Notify MD if PLT < 100 K.  Signed and Held   Signed and Held  Creatinine, serum   (heparin)  Once,   R    Comments:  Baseline for heparin therapy IF NOT ALREADY DRAWN.    Signed and Held   Signed and Held  CBC  Tomorrow morning,   R     Signed and Held   Signed and Held  Basic metabolic panel  Now then every 4 hours,   R     Signed and Held      Vitals/Pain Today's Vitals   05/10/17 1440 05/10/17 1600 05/10/17 1645 05/10/17 1730  BP: (!) 172/98 (!) 157/105 (!) 152/106 140/86  Pulse: (!) 106 (!) 104 (!) 106 (!) 105  Resp:  _0 Temp:      TempSrc:      SpO2: 99% 100% 97% 96%  Weight:      Height:        Isolation Precautions No active isolations  Medications Medications  insulin regular (NOVOLIN R,HUMULIN R) 100 Units in sodium chloride 0.9 % 100 mL (1 Units/mL) infusion (5.8 Units/hr Intravenous Rate/Dose Change 05/10/17 1751)  ondansetron (ZOFRAN) injection 4 mg (has no administration in time range)  dextrose 5 %-0.45 % sodium chloride infusion (has no administration in time range)  sodium chloride 0.9 % bolus 1,000 mL (0 mLs Intravenous Stopped 05/10/17 1432)  sodium chloride 0.9 % bolus 1,000 mL (0 mLs Intravenous Stopped 05/10/17 1432)  sodium chloride 0.9 % bolus 1,000 mL (0 mLs Intravenous Stopped 05/10/17 1502)    Mobility walks

## 2017-05-10 NOTE — ED Triage Notes (Signed)
Pt not feeling well for weeks, weight loss, polyuria, polydipsia. Dizzy with standing, + ortho changes. Reported by EMS

## 2017-05-11 ENCOUNTER — Other Ambulatory Visit: Payer: Self-pay

## 2017-05-11 ENCOUNTER — Encounter (HOSPITAL_COMMUNITY): Payer: Self-pay | Admitting: *Deleted

## 2017-05-11 DIAGNOSIS — R9431 Abnormal electrocardiogram [ECG] [EKG]: Secondary | ICD-10-CM

## 2017-05-11 LAB — GLUCOSE, CAPILLARY
GLUCOSE-CAPILLARY: 196 mg/dL — AB (ref 65–99)
GLUCOSE-CAPILLARY: 124 mg/dL — AB (ref 65–99)
GLUCOSE-CAPILLARY: 202 mg/dL — AB (ref 65–99)
GLUCOSE-CAPILLARY: 328 mg/dL — AB (ref 65–99)
Glucose-Capillary: 138 mg/dL — ABNORMAL HIGH (ref 65–99)
Glucose-Capillary: 160 mg/dL — ABNORMAL HIGH (ref 65–99)
Glucose-Capillary: 195 mg/dL — ABNORMAL HIGH (ref 65–99)
Glucose-Capillary: 203 mg/dL — ABNORMAL HIGH (ref 65–99)
Glucose-Capillary: 217 mg/dL — ABNORMAL HIGH (ref 65–99)
Glucose-Capillary: 245 mg/dL — ABNORMAL HIGH (ref 65–99)
Glucose-Capillary: 248 mg/dL — ABNORMAL HIGH (ref 65–99)
Glucose-Capillary: 349 mg/dL — ABNORMAL HIGH (ref 65–99)
Glucose-Capillary: 426 mg/dL — ABNORMAL HIGH (ref 65–99)

## 2017-05-11 LAB — CBC
HEMATOCRIT: 47.5 % (ref 39.0–52.0)
HEMOGLOBIN: 16.4 g/dL (ref 13.0–17.0)
MCH: 29.2 pg (ref 26.0–34.0)
MCHC: 34.5 g/dL (ref 30.0–36.0)
MCV: 84.5 fL (ref 78.0–100.0)
Platelets: 328 10*3/uL (ref 150–400)
RBC: 5.62 MIL/uL (ref 4.22–5.81)
RDW: 13.9 % (ref 11.5–15.5)
WBC: 12.5 10*3/uL — ABNORMAL HIGH (ref 4.0–10.5)

## 2017-05-11 LAB — BASIC METABOLIC PANEL
ANION GAP: 10 (ref 5–15)
Anion gap: 11 (ref 5–15)
Anion gap: 10 (ref 5–15)
BUN: 17 mg/dL (ref 6–20)
BUN: 19 mg/dL (ref 6–20)
BUN: 19 mg/dL (ref 6–20)
CHLORIDE: 115 mmol/L — AB (ref 101–111)
CHLORIDE: 112 mmol/L — AB (ref 101–111)
CO2: 16 mmol/L — AB (ref 22–32)
CO2: 18 mmol/L — AB (ref 22–32)
CO2: 18 mmol/L — ABNORMAL LOW (ref 22–32)
CREATININE: 0.77 mg/dL (ref 0.61–1.24)
CREATININE: 0.77 mg/dL (ref 0.61–1.24)
CREATININE: 0.79 mg/dL (ref 0.61–1.24)
Calcium: 9.2 mg/dL (ref 8.9–10.3)
Calcium: 8.8 mg/dL — ABNORMAL LOW (ref 8.9–10.3)
Calcium: 9.1 mg/dL (ref 8.9–10.3)
Chloride: 111 mmol/L (ref 101–111)
GFR calc Af Amer: >60 mL/min (ref >60)
GFR calc non Af Amer: >60 mL/min (ref >60)
GFR calc non Af Amer: >60 mL/min (ref >60)
GFR calc non Af Amer: >60 mL/min (ref >60)
GLUCOSE: 170 mg/dL — AB (ref 65–99)
GLUCOSE: 172 mg/dL — AB (ref 65–99)
GLUCOSE: 248 mg/dL — AB (ref 65–99)
POTASSIUM: 4 mmol/L (ref 3.5–5.1)
Potassium: 3.6 mmol/L (ref 3.5–5.1)
Potassium: 3.6 mmol/L (ref 3.5–5.1)
Sodium: 142 mmol/L (ref 135–145)
Sodium: 140 mmol/L (ref 135–145)
Sodium: 139 mmol/L (ref 135–145)

## 2017-05-11 LAB — GLUCOSE, RANDOM: Glucose, Bld: 429 mg/dL — ABNORMAL HIGH (ref 65–99)

## 2017-05-11 LAB — HIV ANTIBODY (ROUTINE TESTING W REFLEX): HIV Screen 4th Generation wRfx: NONREACTIVE

## 2017-05-11 MED ORDER — INSULIN GLARGINE 100 UNIT/ML ~~LOC~~ SOLN
20.0000 [IU] | SUBCUTANEOUS | Status: DC
  Administered 2017-05-11: 20 [IU] via SUBCUTANEOUS
  Filled 2017-05-11: qty 0.2

## 2017-05-11 MED ORDER — INSULIN ASPART 100 UNIT/ML ~~LOC~~ SOLN
0.0000 [IU] | Freq: Every day | SUBCUTANEOUS | Status: DC
  Administered 2017-05-11: 4 [IU] via SUBCUTANEOUS

## 2017-05-11 MED ORDER — INSULIN ASPART 100 UNIT/ML ~~LOC~~ SOLN
0.0000 [IU] | Freq: Three times a day (TID) | SUBCUTANEOUS | Status: DC
  Administered 2017-05-11: 7 [IU] via SUBCUTANEOUS
  Administered 2017-05-12 (×2): 3 [IU] via SUBCUTANEOUS

## 2017-05-11 MED ORDER — LIVING WELL WITH DIABETES BOOK
Freq: Once | Status: AC
  Administered 2017-05-11: 11:00:00
  Filled 2017-05-11: qty 1

## 2017-05-11 MED ORDER — INSULIN ASPART 100 UNIT/ML ~~LOC~~ SOLN
7.0000 [IU] | Freq: Once | SUBCUTANEOUS | Status: AC
  Administered 2017-05-11: 7 [IU] via SUBCUTANEOUS

## 2017-05-11 MED ORDER — INSULIN ASPART PROT & ASPART (70-30 MIX) 100 UNIT/ML ~~LOC~~ SUSP
15.0000 [IU] | Freq: Two times a day (BID) | SUBCUTANEOUS | Status: DC
  Administered 2017-05-11: 15 [IU] via SUBCUTANEOUS
  Filled 2017-05-11: qty 10

## 2017-05-11 NOTE — Progress Notes (Signed)
Patient's BG was 429, Dr on call was notified. RN will wait on new orders.

## 2017-05-11 NOTE — Progress Notes (Signed)
Inpatient Diabetes Program Recommendations  AACE/ADA: New Consensus Statement on Inpatient Glycemic Control (2015)  Target Ranges:  Prepandial:   less than 140 mg/dL      Peak postprandial:   less than 180 mg/dL (1-2 hours)      Critically ill patients:  140 - 180 mg/dL   Results for Robert Ballard, Robert Ballard (MRN [id number]) as of 05/11/2017 09:33  Ref. Range 05/10/2017 12:46  Sodium Latest Ref Range: 135 - 145 mmol/L 130 (L)  Potassium Latest Ref Range: 3.5 - 5.1 mmol/L 5.7 (H)  Chloride Latest Ref Range: 101 - 111 mmol/L 90 (L)  CO2 Latest Ref Range: 22 - 32 mmol/L 12 (L)  Glucose Latest Ref Range: 65 - 99 mg/dL 409 (HH)  BUN Latest Ref Range: 6 - 20 mg/dL 21 (H)  Creatinine Latest Ref Range: 0.61 - 1.24 mg/dL 8.11 (H)  Calcium Latest Ref Range: 8.9 - 10.3 mg/dL 91.4  Anion gap Latest Ref Range: 5 - 15  28.0 (H)   Results for Robert Ballard, Robert Ballard (MRN [id number]) as of 05/11/2017 09:33  Ref. Range 05/10/2017 16:47  Hemoglobin A1C Latest Ref Range: 4.8 - 5.6 % 13.8 (H)     Admit with: DKA/ New Diagnosis of DM   Current Insulin Orders: Lantus 20 units daily      Novolog Sensitive Correction Scale/ SSI (0-9 units) TID AC + HS     MD- Please consider the following:  Consider checking C-peptide level to see if patient trending more towards Type 1 DM?    Patient currently uninsured.  Will need to keep cost considerations in mind when planning discharge medications.  May want to transition patient to 70/30 Insulin BID tonight with dinner.  Patient can purchase 70/30 Insulin at [facility] for $25 per vial.  If Care management can get patient an appointment at the [facility], patient may be able to get Lantus/Levemir and Novolog/Humalog at the clinic pharmacy if they have those insulins in stock.      Note patient transitioned off the IV Insulin drip this AM.  Lantus given at 9am.  This is a new diagnosis of DM for this  patient.  Given presentation with DKA and extremely high A1c level (13.8%), question if patient is trending more towards Type 1 DM?  Will likely need insulin for home given high A1c.  Patient listed as uninsured without PCP.  Have consulted Care Management Team.    Have ordered Educational materials for patient and will ask nursing staff to begin basic DM survival skills education with patient today.  DM Coordinator to follow up with patient on Monday AM.      --Will follow patient during hospitalization--  Ambrose Finland RN, MSN, CDE Diabetes Coordinator Inpatient Glycemic Control Team Team Pager: [phone] (8a-5p)

## 2017-05-11 NOTE — Progress Notes (Signed)
Notified MD of pt's blood sugar of 426.  Orders given.  Will continue to monitor.

## 2017-05-11 NOTE — Progress Notes (Signed)
Patient ID: Robert Ballard, male   DOB: 07-Jun-1985, 32 y.o.   MRN: [id number]  PROGRESS NOTE    Robert Ballard [facility]  WUJ:[id number] DOB: May 17, 1985 DOA: 05/10/2017 PCP: Patient, No Pcp Per   Brief Narrative: 32 year old male with no past medical history presented with nausea and dizziness.  He was found to have extremely elevated blood sugars and was admitted with DKA and started on insulin drip.   Assessment & Plan:   Active Problems:   DKA (diabetic ketoacidoses) (HCC)  Diabetic ketoacidosis -Patient is a new diabetic.  Started on insulin drip.  Anion gap is closed this morning.  Will switch to long-acting insulin subcutaneously and subsequently discontinue insulin drip. -Hemoglobin A1c is 13.8 -Diabetes coordinator consult -Care medicine consult for insulin needs and follow-up -Consistent carbohydrate diet -Continue Accu-Cheks with coverage -Repeat a.m. labs  Elevated LFTs -Questionable cause.  Repeat a.m. Labs.  Will get hepatitis profile and right upper quadrant ultrasound  T wave inversions -No chest discomfort.  Probable outpatient follow-up with cardiology  DVT prophylaxis: Heparin Code Status: Full Family Communication: None at bedside Disposition Plan: Probable discharge in 1 to 2 days if clinically improves  Consultants: None Procedures: None  Antimicrobials: None   Subjective: Patient seen and examined at bedside.  Denies any current nausea or vomiting.  He is hungry.  No overnight fever  Objective: Vitals:   05/11/17 0800 05/11/17 0900 05/11/17 1000 05/11/17 1056  BP: (!) 151/76 135/86 (!) 146/55 (!) 145/86  Pulse:    95  Resp: Temp: 98 F (36.7 C)   (!) 97.4 F (36.3 C)  TempSrc: Oral   Axillary  SpO2: 95% 90% 94% 95%  Weight:      Height:        Intake/Output Summary (Last 24 hours) at 05/11/2017 1149 Last data filed at 05/11/2017 1030 Gross per 24 hour  Intake 3460.93 ml  Output 2000 ml  Net 1460.93 ml    Filed Weights   05/10/17 1207 05/10/17 1850  Weight: 95.3 kg (210 lb) 100 kg (220 lb 7.4 oz)    Examination:  General exam: Appears calm and comfortable  Respiratory system: Bilateral decreased breath sound at bases Cardiovascular system: S1 & S2 heard, rate controlled  gastrointestinal system: Abdomen is nondistended, soft and nontender. Normal bowel sounds heard. Central nervous system: Alert and oriented. No focal neurological deficits. Moving extremities Extremities: No cyanosis, clubbing, edema  Skin: No rashes, lesions or ulcers Psychiatry: Poor insight    Data Reviewed: I have personally reviewed following labs and imaging studies  CBC: Recent Labs  Lab 05/10/17 1246 05/10/17 1917 05/11/17 0316  WBC 13.6* 14.1* 12.5*  HGB 17.8* 17.4* 16.4  HCT 52.4* 49.6 47.5  MCV 88.4 86.4 84.5  PLT 367 368 328   Basic Metabolic Panel: Recent Labs  Lab 05/10/17 1917 05/10/17 2240 05/11/17 0316 05/11/17 0633 05/11/17 1041  NA 139 141 142 140 139  K 4.2 4.0 3.6 3.6 4.0  CL 109 111 115* 112* 111  CO2 10* 15* 16* 18* 18*  GLUCOSE 350* 256* 170* 172* 248*  BUN CREATININE 1.16 1.02 0.77 0.77 0.79  CALCIUM 9.1 9.2 9.2 8.8* 9.1   GFR: Estimated Creatinine Clearance: 154.7 mL/min (by C-G formula based on SCr of 0.79 mg/dL). Liver Function Tests: Recent Labs  Lab 05/10/17 1246  AST 50*  ALT 106*  ALKPHOS 138*  BILITOT UNABLE TO REPORT DUE TO LIPEMIC INTERFERENCE  PROT 7.9  ALBUMIN 4.0   Recent Labs  Lab 05/10/17 1246  LIPASE 94*   No results for input(s): AMMONIA in the last 168 hours. Coagulation Profile: No results for input(s): INR, PROTIME in the last 168 hours. Cardiac Enzymes: No results for input(s): CKTOTAL, CKMB, CKMBINDEX, TROPONINI in the last 168 hours. BNP (last 3 results) No results for input(s): PROBNP in the last 8760 hours. HbA1C: Recent Labs    05/10/17 1647  HGBA1C 13.8*   CBG: Recent Labs  Lab 05/11/17 0532  05/11/17 0640 05/11/17 0747 05/11/17 0849 05/11/17 0947  GLUCAP 138* 160* 202* 195* 245*   Lipid Profile: No results for input(s): CHOL, HDL, LDLCALC, TRIG, CHOLHDL, LDLDIRECT in the last 72 hours. Thyroid Function Tests: No results for input(s): TSH, T4TOTAL, FREET4, T3FREE, THYROIDAB in the last 72 hours. Anemia Panel: No results for input(s): VITAMINB12, FOLATE, FERRITIN, TIBC, IRON, RETICCTPCT in the last 72 hours. Sepsis Labs: No results for input(s): PROCALCITON, LATICACIDVEN in the last 168 hours.  Recent Results (from the past 240 hour(s))  MRSA PCR Screening     Status: None   Collection Time: 05/10/17  7:00 PM  Result Value Ref Range Status   MRSA by PCR NEGATIVE NEGATIVE Final    Comment:        The GeneXpert MRSA Assay (FDA approved for NASAL specimens only), is one component of a comprehensive MRSA colonization surveillance program. It is not intended to diagnose MRSA infection nor to guide or monitor treatment for MRSA infections. Performed at [facility], [address], [place], [place] [address]          Radiology Studies: No results found.      Scheduled Meds: . heparin  5,000 Units Subcutaneous Q8H  . insulin aspart  0-5 Units Subcutaneous QHS  . insulin aspart  0-9 Units Subcutaneous TID WC  . insulin glargine  20 Units Subcutaneous Q24H   Continuous Infusions: . sodium chloride Stopped (05/10/17 2217)  . dextrose 5 % and 0.45% NaCl 100 mL/hr at 05/11/17 0821  . insulin (NOVOLIN-R) infusion Stopped (05/11/17 1030)     LOS: 1 day        Glade Lloyd, MD [facility] Pager [phone]  If 7PM-7AM, please contact night-coverage [email or web address] Password [facility] 05/11/2017, 11:49 AM

## 2017-05-12 ENCOUNTER — Inpatient Hospital Stay (HOSPITAL_COMMUNITY): Payer: Self-pay

## 2017-05-12 DIAGNOSIS — E1110 Type 2 diabetes mellitus with ketoacidosis without coma: Principal | ICD-10-CM

## 2017-05-12 DIAGNOSIS — R945 Abnormal results of liver function studies: Secondary | ICD-10-CM

## 2017-05-12 LAB — CBC WITH DIFFERENTIAL/PLATELET
BASOS PCT: 1 %
Basophils Absolute: 0.1 10*3/uL (ref 0.0–0.1)
EOS ABS: 0.3 10*3/uL (ref 0.0–0.7)
Eosinophils Relative: 3 %
HCT: 43.7 % (ref 39.0–52.0)
HEMOGLOBIN: 14.3 g/dL (ref 13.0–17.0)
Lymphocytes Relative: 21 %
Lymphs Abs: 2.4 10*3/uL (ref 0.7–4.0)
MCH: 28.7 pg (ref 26.0–34.0)
MCHC: 32.7 g/dL (ref 30.0–36.0)
MCV: 87.6 fL (ref 78.0–100.0)
Monocytes Absolute: 0.6 10*3/uL (ref 0.1–1.0)
Monocytes Relative: 5 %
NEUTROS PCT: 70 %
Neutro Abs: 8 10*3/uL — ABNORMAL HIGH (ref 1.7–7.7)
Platelets: 270 10*3/uL (ref 150–400)
RBC: 4.99 MIL/uL (ref 4.22–5.81)
RDW: 14.1 % (ref 11.5–15.5)
WBC: 11.4 10*3/uL — ABNORMAL HIGH (ref 4.0–10.5)

## 2017-05-12 LAB — COMPREHENSIVE METABOLIC PANEL
ALT: 73 U/L — AB (ref 17–63)
AST: 34 U/L (ref 15–41)
Albumin: 3.2 g/dL — ABNORMAL LOW (ref 3.5–5.0)
Alkaline Phosphatase: 98 U/L (ref 38–126)
Anion gap: 10 (ref 5–15)
BUN: 18 mg/dL (ref 6–20)
CHLORIDE: 108 mmol/L (ref 101–111)
CO2: 20 mmol/L — ABNORMAL LOW (ref 22–32)
CREATININE: 0.8 mg/dL (ref 0.61–1.24)
Calcium: 8.9 mg/dL (ref 8.9–10.3)
GFR calc Af Amer: >60 mL/min (ref >60)
GFR calc non Af Amer: >60 mL/min (ref >60)
Glucose, Bld: 258 mg/dL — ABNORMAL HIGH (ref 65–99)
Potassium: 3.5 mmol/L (ref 3.5–5.1)
SODIUM: 138 mmol/L (ref 135–145)
Total Bilirubin: 0.7 mg/dL (ref 0.3–1.2)
Total Protein: 6.3 g/dL — ABNORMAL LOW (ref 6.5–8.1)

## 2017-05-12 LAB — GLUCOSE, CAPILLARY
GLUCOSE-CAPILLARY: 334 mg/dL — AB (ref 65–99)
GLUCOSE-CAPILLARY: 283 mg/dL — AB (ref 65–99)
Glucose-Capillary: 240 mg/dL — ABNORMAL HIGH (ref 65–99)
Glucose-Capillary: 249 mg/dL — ABNORMAL HIGH (ref 65–99)

## 2017-05-12 LAB — MAGNESIUM: MAGNESIUM: 2.2 mg/dL (ref 1.7–2.4)

## 2017-05-12 MED ORDER — INSULIN NPH ISOPHANE & REGULAR (70-30) 100 UNIT/ML ~~LOC~~ SUSP: 20.0000 [IU] | Freq: Two times a day (BID) | SUBCUTANEOUS | 1 refills | Status: DC

## 2017-05-12 MED ORDER — "INSULIN SYRINGE 31G X 5/16"" 0.5 ML MISC": 0 refills | Status: DC

## 2017-05-12 MED ORDER — INSULIN STARTER KIT- SYRINGES (SPANISH)
1.0000 | Freq: Once | Status: AC
  Administered 2017-05-12: 1
  Filled 2017-05-12: qty 1

## 2017-05-12 MED ORDER — LIVING WELL WITH DIABETES BOOK - IN SPANISH
Freq: Once | Status: AC
  Administered 2017-05-12: 11:00:00
  Filled 2017-05-12: qty 1

## 2017-05-12 MED ORDER — INSULIN ASPART PROT & ASPART (70-30 MIX) 100 UNIT/ML ~~LOC~~ SUSP
20.0000 [IU] | Freq: Two times a day (BID) | SUBCUTANEOUS | Status: DC
  Administered 2017-05-12: 10 [IU] via SUBCUTANEOUS

## 2017-05-12 MED ORDER — BLOOD GLUCOSE MONITOR KIT: PACK | 0 refills | Status: DC

## 2017-05-12 NOTE — Progress Notes (Signed)
Inpatient Diabetes Program Recommendations  AACE/ADA: New Consensus Statement on Inpatient Glycemic Control (2015)  Target Ranges:  Prepandial:   less than 140 mg/dL      Peak postprandial:   less than 180 mg/dL (1-2 hours)      Critically ill patients:  140 - 180 mg/dL   Lab Results  Component Value Date   GLUCAP 283 (H) 05/12/2017   HGBA1C 13.8 (H) 05/10/2017    Review of Glycemic Control  Diabetes history: New onset  Inpatient Diabetes Program Recommendations:   Spoke with pt @ bedside about new diagnosis. Discussed A1C results 13.8 (average blood glucose 349 over the past 2-3 months time) and explained what an A1C is, basic pathophysiology of DM Type 2, basic home care, basic diabetes diet nutrition principles, importance of checking CBGs and maintaining good CBG control to prevent long-term and short-term complications. Reviewed signs and symptoms of hyperglycemia and hypoglycemia and how to treat hypoglycemia at home. Also reviewed blood sugar goals at home.  RNs to provide ongoing basic DM education at bedside with this patient. Have ordered educational booklet, insulin starter kit, and DM videos.   Patient shared that he currently works from 5 am (leaves home without eating) and returns home @ 10 pm. 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 12 cans of regular coke per day. Patient had already discussed with his wife changes needed in his schedule and content of meals and work schedule. Patient shared he plans to cut out sodas, eat breakfast and dinner and work less hrs to be with his family. Discussed basic plate method with patient and to limit carbohydrates such as rice,pasta and tortillas which is in his regular diet. Educated patient on insulin injection and pen use at home.  Reviewed all steps of insulin pen including attachment of needle, 2-unit air shot, dialing up dose, giving injection, removing needle, disposal of sharps, storage of unused insulin,  disposal of insulin etc. Patient able to provide successful return demonstration. Also reviewed troubleshooting with insulin pen. Explained to patient Relion Novolin 70/30 insulin available @ [facility] to take bid ac meals. Also Relion meter and strips less expensive if purchasing out of pocket. Spoke with RN Apolonio Schneiders and plans to allow patient to give own injections and continue education to prepare patient for insulin administration for home use.  Thank you, Nani Gasser. Shaquila Sigman, RN, MSN, CDE  Diabetes Coordinator Inpatient Glycemic Control Team Team Pager [phone] (8am-5pm) 05/12/2017 12:19 PM

## 2017-05-12 NOTE — Discharge Summary (Signed)
Physician Discharge Summary  Robert Ballard [facility] OXB:[id number] DOB: February 06, 1985 DOA: 05/10/2017  PCP: Patient, No Pcp Per  Admit date: 05/10/2017 Discharge date: 05/12/2017  Admitted From: Home Disposition:  Home  Recommendations for Outpatient Follow-up:  1. Follow up with PCP in 1 week 2. Compliance with medications including insulin   Home Health: No Equipment/Devices: None  Discharge Condition: Stable CODE STATUS: Full Diet recommendation: Carb Modified  Brief/Interim Summary: 32 year old male with no past medical history presented with nausea and dizziness.  He was found to have extremely elevated blood sugars and was admitted with DKA and started on insulin drip.  He was transitioned to long-acting insulin after anion gap closed.  Diabetes coordinator was consulted.  He was transitioned to 70/30 insulin.  He will be discharged home with outpatient follow-up with primary care provider.    Discharge Diagnoses:  Active Problems:   DKA (diabetic ketoacidoses) ([place])  Diabetic ketoacidosis -Patient is a new diabetic.  Started on insulin drip,  which was subsequently changed to long acting subcutaneous insulin after anion gap closed. Insulin was then switched to 70/30 for easier discharge planning.   -Hemoglobin A1c is 13.8 -Outpatient follow up with PCP and/or endocrinology -Care medicine is setting of outpatient follow-up -Consistent carbohydrate diet -Compliance with medications -Discharge home on 70/30 insulin  Elevated LFTs -Questionable cause.    Improving.  Right upper quadrant ultrasound showed fatty liver   T wave inversions -No chest discomfort.  Outpatient follow-up with cardiology    Discharge Instructions  Discharge Instructions    Call MD for:  difficulty breathing, headache or visual disturbances   Complete by:  As directed    Call MD for:  extreme fatigue   Complete by:  As directed    Call MD for:  hives   Complete by:  As directed    Call  MD for:  persistant dizziness or light-headedness   Complete by:  As directed    Call MD for:  persistant nausea and vomiting   Complete by:  As directed    Call MD for:  severe uncontrolled pain   Complete by:  As directed    Call MD for:  temperature >100.4   Complete by:  As directed    Diet Carb Modified   Complete by:  As directed    Increase activity slowly   Complete by:  As directed      Allergies as of 05/12/2017   No Known Allergies     Medication List    TAKE these medications   blood glucose meter kit and supplies Kit Dispense based on patient and insurance preference. Use up to four times daily as directed. (FOR ICD-9 250.00, 250.01).   insulin NPH-regular Human (70-30) 100 UNIT/ML injection Commonly known as:  NOVOLIN 70/30 RELION Inject 20 Units into the skin 2 (two) times daily with a meal.   INSULIN SYRINGE .5CC/31GX5/16" 31G X 5/16" 0.5 ML Misc 70/30 20 units bid      Follow-up [place]. Schedule an appointment as soon as possible for a visit in 1 week(s).   Why:  Call on Monday at 8 am to see if appt available for hospital follow up Contact information: [address] [place] [address] [phone]         No Known Allergies  Consultations:  None   Procedures/Studies: US Abdomen Limited  Result Date: 05/12/2017 CLINICAL DATA:  Elevated liver function studies EXAM: ULTRASOUND ABDOMEN  LIMITED RIGHT UPPER QUADRANT COMPARISON:  None in PACs FINDINGS: Gallbladder: No gallstones or wall thickening visualized. No sonographic Murphy sign noted by sonographer. Common bile duct: Diameter: 2.5 mm Liver: The hepatic echotexture is increased diffusely. The surface contour is smooth. There is no focal mass or ductal dilation. Portal vein is patent on color Doppler imaging with normal direction of blood flow towards the liver. IMPRESSION: Increased hepatic echotexture most compatible with  fatty infiltrative change. No acute or chronic gallbladder abnormality. Electronically Signed   By: David  Martinique M.D.   On: 05/12/2017 09:57     Subjective: Patient seen and examined at bedside.  He denies any overnight fever, nausea or vomiting.  He is tolerating diet.  He wants to go home.  Discharge Exam: Vitals:   05/11/17 2045 05/12/17 0518  BP: 136/81 129/68  Pulse: 80 79  Resp: 18 16  Temp: 97.8 F (36.6 C) 97.6 F (36.4 C)  SpO2: 100% 99%   Vitals:   05/11/17 1056 05/11/17 1410 05/11/17 2045 05/12/17 0518  BP: (!) 145/86 (!) 150/80 136/81 129/68  Pulse: 95 94 80 79  Resp: _0 Temp: (!) 97.4 F (36.3 C) 98.2 F (36.8 C) 97.8 F (36.6 C) 97.6 F (36.4 C)  TempSrc: Axillary Oral Oral   SpO2: 95% 98% 100% 99%  Weight:      Height:        General: Pt is alert, awake, not in acute distress Cardiovascular: Rate controlled, S1/S2 + Respiratory: Bilateral decreased breath sounds at bases Abdominal: Soft, NT, ND, bowel sounds + Extremities: no edema, no cyanosis    The results of significant diagnostics from this hospitalization (including imaging, microbiology, ancillary and laboratory) are listed below for reference.     Microbiology: Recent Results (from the past 240 hour(s))  MRSA PCR Screening     Status: None   Collection Time: 05/10/17  7:00 PM  Result Value Ref Range Status   MRSA by PCR NEGATIVE NEGATIVE Final    Comment:        The GeneXpert MRSA Assay (FDA approved for NASAL specimens only), is one component of a comprehensive MRSA colonization surveillance program. It is not intended to diagnose MRSA infection nor to guide or monitor treatment for MRSA infections. Performed at [facility], [place] [address] [place], [place] [address]      Labs: BNP (last 3 results) No results for input(s): BNP in the last 8760 hours. Basic Metabolic Panel: Recent Labs  Lab 05/10/17 2240 05/11/17 0316 05/11/17 0633  05/11/17 1041 05/11/17 1817 05/12/17 0437  NA 141 142 140 139  --  138  K 4.0 3.6 3.6 4.0  --  3.5  CL 111 115* 112* 111  --  108  CO2 15* 16* 18* 18*  --  20*  GLUCOSE 256* 170* 172* 248* 429* 258*  BUN _1 --  18  CREATININE 1.02 0.77 0.77 0.79  --  0.80  CALCIUM 9.2 9.2 8.8* 9.1  --  8.9  MG  --   --   --   --   --  2.2   Liver Function Tests: Recent Labs  Lab 05/10/17 1246 05/12/17 0437  AST 50* 34  ALT 106* 73*  ALKPHOS 138* 98  BILITOT UNABLE TO REPORT DUE TO LIPEMIC INTERFERENCE 0.7  PROT 7.9 6.3*  ALBUMIN 4.0 3.2*   Recent Labs  Lab 05/10/17 1246  LIPASE 94*   No results for input(s):  AMMONIA in the last 168 hours. CBC: Recent Labs  Lab 05/10/17 1246 05/10/17 1917 05/11/17 0316 05/12/17 0437  WBC 13.6* 14.1* 12.5* 11.4*  NEUTROABS  --   --   --  8.0*  HGB 17.8* 17.4* 16.4 14.3  HCT 52.4* 49.6 47.5 43.7  MCV 88.4 86.4 84.5 87.6  PLT 367 368 328 270   Cardiac Enzymes: No results for input(s): CKTOTAL, CKMB, CKMBINDEX, TROPONINI in the last 168 hours. BNP: Invalid input(s): POCBNP CBG: Recent Labs  Lab 05/11/17 1703 05/11/17 2208 05/12/17 0759 05/12/17 1123 05/12/17 1153  GLUCAP 426* 328* 240* 249* 283*   D-Dimer No results for input(s): DDIMER in the last 72 hours. Hgb A1c Recent Labs    05/10/17 1647  HGBA1C 13.8*   Lipid Profile No results for input(s): CHOL, HDL, LDLCALC, TRIG, CHOLHDL, LDLDIRECT in the last 72 hours. Thyroid function studies No results for input(s): TSH, T4TOTAL, T3FREE, THYROIDAB in the last 72 hours.  Invalid input(s): FREET3 Anemia work up No results for input(s): VITAMINB12, FOLATE, FERRITIN, TIBC, IRON, RETICCTPCT in the last 72 hours. Urinalysis    Component Value Date/Time   COLORURINE STRAW (A) 05/10/2017 1246   APPEARANCEUR CLEAR 05/10/2017 1246   LABSPEC 1.027 05/10/2017 1246   PHURINE 5.0 05/10/2017 1246   GLUCOSEU >=500 (A) 05/10/2017 1246   HGBUR MODERATE (A) 05/10/2017 1246    BILIRUBINUR NEGATIVE 05/10/2017 1246   KETONESUR 80 (A) 05/10/2017 1246   PROTEINUR 100 (A) 05/10/2017 1246   NITRITE NEGATIVE 05/10/2017 1246   LEUKOCYTESUR NEGATIVE 05/10/2017 1246   Sepsis Labs Invalid input(s): PROCALCITONIN,  WBC,  LACTICIDVEN Microbiology Recent Results (from the past 240 hour(s))  MRSA PCR Screening     Status: None   Collection Time: 05/10/17  7:00 PM  Result Value Ref Range Status   MRSA by PCR NEGATIVE NEGATIVE Final    Comment:        The GeneXpert MRSA Assay (FDA approved for NASAL specimens only), is one component of a comprehensive MRSA colonization surveillance program. It is not intended to diagnose MRSA infection nor to guide or monitor treatment for MRSA infections. Performed at [facility], [place] [address], [place], [place] [address]      Time coordinating discharge: 35 minutes  SIGNED:   Aline August, MD  [facility] 05/12/2017, 12:09 PM Pager: [phone]  If 7PM-7AM, please contact night-coverage [email or web address] Password [email or web address]

## 2017-05-12 NOTE — Progress Notes (Signed)
Spoke with patient and spouse at bedside. Unable to secure f/u appt but contacted [facility] and they state patient should call next Monday at 8am that he should be able to get in to see them then. All appts this week were full and they would not make appt for next week until Monday. Patient states he understands his medication needs and will be able to afford supplies and meds at d/c. He is eager to go home. No further HH needs assessed. [phone]

## 2017-05-12 NOTE — Progress Notes (Signed)
Patient watched videos on diabetes, he had no questions. Patient was given discharge instructions and prescriptions and was escorted to the main entrance by NT.

## 2017-05-13 LAB — HEPATITIS PANEL, ACUTE
HCV Ab: <0.1 s/co ratio (ref 0.0–0.9)
HEP B C IGM: NEGATIVE
Hep A IgM: NEGATIVE
Hepatitis B Surface Ag: NEGATIVE

## 2017-05-13 LAB — C-PEPTIDE: C PEPTIDE: 2.1 ng/mL (ref 1.1–4.4)

## 2018-10-02 ENCOUNTER — Emergency Department (HOSPITAL_COMMUNITY): Payer: Self-pay

## 2018-10-02 ENCOUNTER — Encounter (HOSPITAL_COMMUNITY): Payer: Self-pay | Admitting: Emergency Medicine

## 2018-10-02 ENCOUNTER — Inpatient Hospital Stay (HOSPITAL_COMMUNITY)
Admission: EM | Admit: 2018-10-02 | Discharge: 2018-10-05 | DRG: 638 | Disposition: A | Discharge status: Home / Self Care | Payer: Self-pay | Attending: Internal Medicine | Admitting: Internal Medicine

## 2018-10-02 ENCOUNTER — Other Ambulatory Visit: Payer: Self-pay

## 2018-10-02 DIAGNOSIS — Z6841 Body Mass Index (BMI) 40.0 and over, adult: Secondary | ICD-10-CM

## 2018-10-02 DIAGNOSIS — E1310 Other specified diabetes mellitus with ketoacidosis without coma: Secondary | ICD-10-CM

## 2018-10-02 DIAGNOSIS — I10 Essential (primary) hypertension: Secondary | ICD-10-CM

## 2018-10-02 DIAGNOSIS — N179 Acute kidney failure, unspecified: Secondary | ICD-10-CM | POA: Diagnosis present

## 2018-10-02 DIAGNOSIS — Z794 Long term (current) use of insulin: Secondary | ICD-10-CM

## 2018-10-02 DIAGNOSIS — E1121 Type 2 diabetes mellitus with diabetic nephropathy: Secondary | ICD-10-CM | POA: Diagnosis present

## 2018-10-02 DIAGNOSIS — E875 Hyperkalemia: Secondary | ICD-10-CM | POA: Diagnosis present

## 2018-10-02 DIAGNOSIS — D72829 Elevated white blood cell count, unspecified: Secondary | ICD-10-CM | POA: Diagnosis present

## 2018-10-02 DIAGNOSIS — E1110 Type 2 diabetes mellitus with ketoacidosis without coma: Principal | ICD-10-CM

## 2018-10-02 DIAGNOSIS — Z87891 Personal history of nicotine dependence: Secondary | ICD-10-CM

## 2018-10-02 DIAGNOSIS — Z20828 Contact with and (suspected) exposure to other viral communicable diseases: Secondary | ICD-10-CM | POA: Diagnosis present

## 2018-10-02 DIAGNOSIS — Z9114 Patient's other noncompliance with medication regimen: Secondary | ICD-10-CM

## 2018-10-02 HISTORY — DX: Type 2 diabetes mellitus without complications: E11.9

## 2018-10-02 LAB — I-STAT CHEM 8, ED
BUN: 18 mg/dL (ref 6–20)
Calcium, Ion: 1.12 mmol/L — ABNORMAL LOW (ref 1.15–1.40)
Chloride: 108 mmol/L (ref 98–111)
Creatinine, Ser: 0.6 mg/dL — ABNORMAL LOW (ref 0.61–1.24)
Glucose, Bld: 387 mg/dL — ABNORMAL HIGH (ref 70–99)
HCT: 53 % — ABNORMAL HIGH (ref 39.0–52.0)
Hemoglobin: 18 g/dL — ABNORMAL HIGH (ref 13.0–17.0)
Potassium: 4.6 mmol/L (ref 3.5–5.1)
Sodium: 131 mmol/L — ABNORMAL LOW (ref 135–145)
TCO2: 11 mmol/L — ABNORMAL LOW (ref 22–32)

## 2018-10-02 LAB — CBC WITH DIFFERENTIAL/PLATELET
Abs Immature Granulocytes: 0.11 10*3/uL — ABNORMAL HIGH (ref 0.00–0.07)
Basophils Absolute: 0.1 10*3/uL (ref 0.0–0.1)
Basophils Relative: 1 %
Eosinophils Absolute: 0.1 10*3/uL (ref 0.0–0.5)
Eosinophils Relative: 0 %
HCT: 50.9 % (ref 39.0–52.0)
Hemoglobin: 19 g/dL — ABNORMAL HIGH (ref 13.0–17.0)
Immature Granulocytes: 1 %
Lymphocytes Relative: 11 %
Lymphs Abs: 2 10*3/uL (ref 0.7–4.0)
MCH: 32.9 pg (ref 26.0–34.0)
MCHC: 37.3 g/dL — ABNORMAL HIGH (ref 30.0–36.0)
MCV: 88.2 fL (ref 80.0–100.0)
Monocytes Absolute: 0.8 10*3/uL (ref 0.1–1.0)
Monocytes Relative: 5 %
Neutro Abs: 14.8 10*3/uL — ABNORMAL HIGH (ref 1.7–7.7)
Neutrophils Relative %: 82 %
Platelets: 342 10*3/uL (ref 150–400)
RBC: 5.77 MIL/uL (ref 4.22–5.81)
RDW: 13.7 % (ref 11.5–15.5)
WBC: 17.8 10*3/uL — ABNORMAL HIGH (ref 4.0–10.5)
nRBC: 0 % (ref 0.0–0.2)

## 2018-10-02 LAB — CBG MONITORING, ED: Glucose-Capillary: 369 mg/dL — ABNORMAL HIGH (ref 70–99)

## 2018-10-02 MED ORDER — SODIUM CHLORIDE 0.9 % IV BOLUS
1000.0000 mL | Freq: Once | INTRAVENOUS | Status: AC
  Administered 2018-10-02: 1000 mL via INTRAVENOUS

## 2018-10-02 MED ORDER — SODIUM CHLORIDE 0.9 % IV SOLN
2.0000 g | Freq: Once | INTRAVENOUS | Status: AC
  Administered 2018-10-03: 2 g via INTRAVENOUS
  Filled 2018-10-02: qty 2

## 2018-10-02 MED ORDER — VANCOMYCIN HCL IN DEXTROSE 1-5 GM/200ML-% IV SOLN: 1000.0000 mg | Freq: Once | INTRAVENOUS | Status: DC

## 2018-10-02 MED ORDER — METRONIDAZOLE IN NACL 5-0.79 MG/ML-% IV SOLN
500.0000 mg | Freq: Once | INTRAVENOUS | Status: AC
  Administered 2018-10-03: 500 mg via INTRAVENOUS
  Filled 2018-10-02: qty 100

## 2018-10-02 MED ORDER — SODIUM CHLORIDE 0.9 % IV BOLUS
1000.0000 mL | Freq: Once | INTRAVENOUS | Status: AC
  Administered 2018-10-03: 1000 mL via INTRAVENOUS

## 2018-10-02 MED ORDER — VANCOMYCIN HCL 10 G IV SOLR
2500.0000 mg | Freq: Once | INTRAVENOUS | Status: AC
  Administered 2018-10-03: 2500 mg via INTRAVENOUS
  Filled 2018-10-02: qty 2500

## 2018-10-02 NOTE — ED Provider Notes (Signed)
[facility] EMERGENCY DEPARTMENT Provider Note   CSN: [id number] Arrival date & time: 10/02/18  2221     History   Chief Complaint No chief complaint on file.   HPI Robert Ballard is a 33 y.o. male who presents with a cc of "feeling bad."  Patient states that for the past 3 days he has been feeling very badly.  He has had increased urination and thirst.  He denies chest pain, shortness of breath, nausea or vomiting.  He has never had anything like this before.  He denies fevers, cough or exposure to coronavirus.Nursing intake states that paitne has "No history of Diabetes," however review of EMR shows a longstanding hx of diabetes with attempts to medicate and treat the condition at the [facility].     HPI  Past Medical History:  Diagnosis Date  . Back pain   . Diabetes mellitus without complication ([place])   . Hypertension     Patient Active Problem List   Diagnosis Date Noted  . Hypertension   . Leukocytosis   . Hyperkalemia   . Diabetes mellitus without complication ([place])   . DKA (diabetic ketoacidoses) ([place]) 05/10/2017    History reviewed. No pertinent surgical history.      Home Medications    Prior to Admission medications   Medication Sig Start Date End Date Taking? Authorizing Provider  blood glucose meter kit and supplies KIT Dispense based on patient and insurance preference. Use up to four times daily as directed. (FOR ICD-9 250.00, 250.01). 05/12/17   Aline August, MD  insulin NPH-regular Human (NOVOLIN 70/30 RELION) (70-30) 100 UNIT/ML injection Inject 20 Units into the skin 2 (two) times daily with a meal. Patient not taking: Reported on 10/02/2018 05/12/17   Aline August, MD  Insulin Syringe-Needle U-100 (INSULIN SYRINGE .5CC/31GX5/16") 31G X 5/16" 0.5 ML MISC 70/30 20 units bid 05/12/17   Aline August, MD    Family History No family history on file.  Social History Social History   Tobacco Use  . Smoking status: Former  Research scientist (life sciences)  . Smokeless tobacco: Never Used  Substance Use Topics  . Alcohol use: Yes  . Drug use: No     Allergies   Patient has no known allergies.   Review of Systems Review of Systems  Ten systems reviewed and are negative for acute change, except as noted in the HPI.   Physical Exam Updated Vital Signs BP (!) 144/84   Pulse 100   Temp 98.4 F (36.9 C) (Oral)   Resp 20   Ht 5' 8"  (1.727 m)   Wt 127 kg   SpO2 99%   BMI 42.57 kg/m   Physical Exam Vitals signs and nursing note reviewed.  Constitutional:      General: He is not in acute distress.    Appearance: He is well-developed. He is not diaphoretic.  HENT:     Head: Normocephalic and atraumatic.  Eyes:     General: No scleral icterus.    Conjunctiva/sclera: Conjunctivae normal.  Neck:     Musculoskeletal: Normal range of motion and neck supple.  Cardiovascular:     Rate and Rhythm: Normal rate and regular rhythm.     Heart sounds: Normal heart sounds.  Pulmonary:     Effort: Pulmonary effort is normal. Tachypnea present. No respiratory distress.     Breath sounds: Normal breath sounds.  Abdominal:     Palpations: Abdomen is soft.     Tenderness: There is no abdominal tenderness.  Skin:    General: Skin is warm and dry.  Neurological:     Mental Status: He is alert.  Psychiatric:        Behavior: Behavior normal.      ED Treatments / Results  Labs (all labs ordered are listed, but only abnormal results are displayed) Labs Reviewed  CBC WITH DIFFERENTIAL/PLATELET - Abnormal; Notable for the following components:      Result Value   WBC 17.8 (*)    Hemoglobin 19.0 (*)    MCHC 37.3 (*)    Neutro Abs 14.8 (*)    Abs Immature Granulocytes 0.11 (*)    All other components within normal limits  URINALYSIS, ROUTINE W REFLEX MICROSCOPIC - Abnormal; Notable for the following components:   Color, Urine STRAW (*)    Glucose, UA >=500 (*)    Hgb urine dipstick MODERATE (*)    Ketones, ur 80 (*)     Protein, ur 100 (*)    Bacteria, UA RARE (*)    All other components within normal limits  COMPREHENSIVE METABOLIC PANEL - Abnormal; Notable for the following components:   Sodium 132 (*)    Potassium 5.6 (*)    CO2 7 (*)    Glucose, Bld 361 (*)    Creatinine, Ser 1.32 (*)    AST 56 (*)    ALT 47 (*)    Alkaline Phosphatase 134 (*)    Total Bilirubin 3.2 (*)    Anion gap 24 (*)    All other components within normal limits  BASIC METABOLIC PANEL - Abnormal; Notable for the following components:   Potassium 5.8 (*)    CO2 14 (*)    Glucose, Bld 181 (*)    Calcium 8.1 (*)    All other components within normal limits  HEMOGLOBIN A1C - Abnormal; Notable for the following components:   Hgb A1c MFr Bld 14.4 (*)    All other components within normal limits  CBC WITH DIFFERENTIAL/PLATELET - Abnormal; Notable for the following components:   WBC 17.9 (*)    Neutro Abs 14.2 (*)    Abs Immature Granulocytes 0.11 (*)    All other components within normal limits  I-STAT CHEM 8, ED - Abnormal; Notable for the following components:   Sodium 131 (*)    Creatinine, Ser 0.60 (*)    Glucose, Bld 387 (*)    Calcium, Ion 1.12 (*)    TCO2 11 (*)    Hemoglobin 18.0 (*)    HCT 53.0 (*)    All other components within normal limits  CBG MONITORING, ED - Abnormal; Notable for the following components:   Glucose-Capillary 369 (*)    All other components within normal limits  CBG MONITORING, ED - Abnormal; Notable for the following components:   Glucose-Capillary 251 (*)    All other components within normal limits  CBG MONITORING, ED - Abnormal; Notable for the following components:   Glucose-Capillary 308 (*)    All other components within normal limits  CBG MONITORING, ED - Abnormal; Notable for the following components:   Glucose-Capillary 247 (*)    All other components within normal limits  CBG MONITORING, ED - Abnormal; Notable for the following components:   Glucose-Capillary 273 (*)     All other components within normal limits  CBG MONITORING, ED - Abnormal; Notable for the following components:   Glucose-Capillary 248 (*)    All other components within normal limits  CBG MONITORING, ED - Abnormal; Notable for  the following components:   Glucose-Capillary 256 (*)    All other components within normal limits  CBG MONITORING, ED - Abnormal; Notable for the following components:   Glucose-Capillary 168 (*)    All other components within normal limits  CBG MONITORING, ED - Abnormal; Notable for the following components:   Glucose-Capillary 181 (*)    All other components within normal limits  CBG MONITORING, ED - Abnormal; Notable for the following components:   Glucose-Capillary 168 (*)    All other components within normal limits  CBG MONITORING, ED - Abnormal; Notable for the following components:   Glucose-Capillary 179 (*)    All other components within normal limits  SARS CORONAVIRUS 2 (TAT 6-24 HRS)  CULTURE, BLOOD (ROUTINE X 2)  CULTURE, BLOOD (ROUTINE X 2)  URINE CULTURE  LACTIC ACID, PLASMA  APTT  PROTIME-INR  HIV ANTIBODY (ROUTINE TESTING W REFLEX)  BASIC METABOLIC PANEL  BASIC METABOLIC PANEL  BASIC METABOLIC PANEL  BASIC METABOLIC PANEL  BASIC METABOLIC PANEL    EKG EKG Interpretation  Date/Time:  Friday October 02 2018 22:29:47 EDT Ventricular Rate:  122 PR Interval:    QRS Duration: 86 QT Interval:  274 QTC Calculation: 391 R Axis:   34 Text Interpretation:  Sinus tachycardia Probable left atrial enlargement Abnormal T, consider ischemia, diffuse leads Artifact in lead(s) I III aVR aVL aVF Confirmed by Dene Gentry [phone]) on 10/02/2018 11:02:31 PM   Radiology Dg Chest Port 1 View  Result Date: 10/02/2018 CLINICAL DATA:  Shortness of breath EXAM: PORTABLE CHEST 1 VIEW COMPARISON:  None. FINDINGS: The heart size and mediastinal contours are within normal limits. Both lungs are clear. The visualized skeletal structures are unremarkable.  IMPRESSION: No acute cardiopulmonary process. Electronically Signed   By: Prudencio Pair M.D.   On: 10/02/2018 22:42    Procedures Procedures (including critical care time)  Medications Ordered in ED Medications  insulin regular bolus via infusion 0-10 Units (0 Units Intravenous Not Given 10/03/18 1215)  insulin regular, human (MYXREDLIN) 100 units/ 100 mL infusion (7.6 Units/hr Intravenous Rate/Dose Change 10/03/18 1142)  dextrose 50 % solution 25 mL (has no administration in time range)  dextrose 5 %-0.45 % sodium chloride infusion ( Intravenous Hold 10/03/18 0739)  0.9 %  sodium chloride infusion ( Intravenous Hold 10/03/18 0725)  dextrose 5 %-0.45 % sodium chloride infusion ( Intravenous New Bag/Given 10/03/18 1108)  enoxaparin (LOVENOX) injection 40 mg (has no administration in time range)  0.9 %  sodium chloride infusion ( Intravenous Hold 10/03/18 0724)  ondansetron (ZOFRAN) injection 4 mg (4 mg Intravenous Given 10/03/18 0611)  hydrALAZINE (APRESOLINE) tablet 25 mg (has no administration in time range)  acetaminophen (TYLENOL) tablet 650 mg (has no administration in time range)  living well with diabetes book MISC (0 each Does not apply Hold 10/03/18 1041)  sodium chloride 0.9 % bolus 1,000 mL (0 mLs Intravenous Stopped 10/03/18 0012)  sodium chloride 0.9 % bolus 1,000 mL (0 mLs Intravenous Stopped 10/03/18 0110)  ceFEPIme (MAXIPIME) 2 g in sodium chloride 0.9 % 100 mL IVPB (0 g Intravenous Stopped 10/03/18 0047)  metroNIDAZOLE (FLAGYL) IVPB 500 mg (0 mg Intravenous Stopped 10/03/18 0207)  vancomycin (VANCOCIN) 2,500 mg in sodium chloride 0.9 % 500 mL IVPB (0 mg Intravenous Stopped 10/03/18 0533)  ondansetron (ZOFRAN) injection 4 mg (4 mg Intravenous Given 10/03/18 0053)  sodium chloride 0.9 % bolus 1,000 mL (0 mLs Intravenous Stopped 10/03/18 0553)     Initial Impression / Assessment and Plan /  ED Course  I have reviewed the triage vital signs and the nursing notes.  Pertinent labs & imaging  results that were available during my care of the patient were reviewed by me and considered in my medical decision making (see chart for details).        Patient here with hyperglycemia,  Initial labs on chem 8 show no hyperkalemia and Gap of 12. Considering the patient's tachycardia, and elevated White count he meets 2 sirs criteria and I have begun sepsis protocol, however I have a much higher suspicion for dka as the cause. Patient's work up os still pendign and I have given sign out to Dr Dina Rich.   Final Clinical Impressions(s) / ED Diagnoses   Final diagnoses:  Diabetic ketoacidosis without coma associated with other specified diabetes mellitus [facility])    ED Discharge Orders    None       Margarita Mail, PA-C 10/03/18 1303    Merryl Hacker, MD 10/03/18 2306

## 2018-10-02 NOTE — ED Triage Notes (Signed)
[place]- pt here for elevated glucose of 361. Pt has no history of diabetes. Pt states he has felt sick the past few days. Polyuria and polydipsia.    97.1 temp 28 RR 96% RA 143/98 BP HR 123

## 2018-10-03 ENCOUNTER — Encounter (HOSPITAL_COMMUNITY): Payer: Self-pay | Admitting: Internal Medicine

## 2018-10-03 DIAGNOSIS — E1010 Type 1 diabetes mellitus with ketoacidosis without coma: Secondary | ICD-10-CM

## 2018-10-03 DIAGNOSIS — Z720 Tobacco use: Secondary | ICD-10-CM

## 2018-10-03 DIAGNOSIS — E875 Hyperkalemia: Secondary | ICD-10-CM | POA: Diagnosis present

## 2018-10-03 DIAGNOSIS — E1121 Type 2 diabetes mellitus with diabetic nephropathy: Secondary | ICD-10-CM

## 2018-10-03 DIAGNOSIS — E1310 Other specified diabetes mellitus with ketoacidosis without coma: Secondary | ICD-10-CM

## 2018-10-03 DIAGNOSIS — D72829 Elevated white blood cell count, unspecified: Secondary | ICD-10-CM | POA: Diagnosis present

## 2018-10-03 DIAGNOSIS — I10 Essential (primary) hypertension: Secondary | ICD-10-CM

## 2018-10-03 LAB — BASIC METABOLIC PANEL WITH GFR
Anion gap: 8 (ref 5–15)
BUN: 7 mg/dL (ref 6–20)
CO2: 18 mmol/L — ABNORMAL LOW (ref 22–32)
Calcium: 8.2 mg/dL — ABNORMAL LOW (ref 8.9–10.3)
Chloride: 107 mmol/L (ref 98–111)
Creatinine, Ser: 0.65 mg/dL (ref 0.61–1.24)
GFR calc Af Amer: >60 mL/min
GFR calc non Af Amer: >60 mL/min
Glucose, Bld: 157 mg/dL — ABNORMAL HIGH (ref 70–99)
Potassium: 4.6 mmol/L (ref 3.5–5.1)
Sodium: 133 mmol/L — ABNORMAL LOW (ref 135–145)

## 2018-10-03 LAB — BASIC METABOLIC PANEL
Anion gap: 13 (ref 5–15)
Anion gap: 12 (ref 5–15)
Anion gap: 14 (ref 5–15)
Anion gap: 15 (ref 5–15)
BUN: 8 mg/dL (ref 6–20)
BUN: 7 mg/dL (ref 6–20)
BUN: 8 mg/dL (ref 6–20)
BUN: 7 mg/dL (ref 6–20)
CO2: 14 mmol/L — ABNORMAL LOW (ref 22–32)
CO2: 12 mmol/L — ABNORMAL LOW (ref 22–32)
CO2: 13 mmol/L — ABNORMAL LOW (ref 22–32)
CO2: 13 mmol/L — ABNORMAL LOW (ref 22–32)
Calcium: 8.1 mg/dL — ABNORMAL LOW (ref 8.9–10.3)
Calcium: 8 mg/dL — ABNORMAL LOW (ref 8.9–10.3)
Calcium: 8.2 mg/dL — ABNORMAL LOW (ref 8.9–10.3)
Calcium: 8.4 mg/dL — ABNORMAL LOW (ref 8.9–10.3)
Chloride: 108 mmol/L (ref 98–111)
Chloride: 111 mmol/L (ref 98–111)
Chloride: 112 mmol/L — ABNORMAL HIGH (ref 98–111)
Chloride: 108 mmol/L (ref 98–111)
Creatinine, Ser: 0.7 mg/dL (ref 0.61–1.24)
Creatinine, Ser: 0.9 mg/dL (ref 0.61–1.24)
Creatinine, Ser: 0.93 mg/dL (ref 0.61–1.24)
Creatinine, Ser: 0.89 mg/dL (ref 0.61–1.24)
GFR calc Af Amer: >60 mL/min (ref >60)
GFR calc Af Amer: >60 mL/min (ref >60)
GFR calc Af Amer: >60 mL/min (ref >60)
GFR calc Af Amer: >60 mL/min (ref >60)
GFR calc non Af Amer: >60 mL/min (ref >60)
GFR calc non Af Amer: >60 mL/min (ref >60)
GFR calc non Af Amer: >60 mL/min (ref >60)
GFR calc non Af Amer: >60 mL/min (ref >60)
Glucose, Bld: 181 mg/dL — ABNORMAL HIGH (ref 70–99)
Glucose, Bld: 185 mg/dL — ABNORMAL HIGH (ref 70–99)
Glucose, Bld: 188 mg/dL — ABNORMAL HIGH (ref 70–99)
Glucose, Bld: 186 mg/dL — ABNORMAL HIGH (ref 70–99)
Potassium: 5.8 mmol/L — ABNORMAL HIGH (ref 3.5–5.1)
Potassium: 5.5 mmol/L — ABNORMAL HIGH (ref 3.5–5.1)
Potassium: 7 mmol/L (ref 3.5–5.1)
Potassium: 4.9 mmol/L (ref 3.5–5.1)
Sodium: 135 mmol/L (ref 135–145)
Sodium: 136 mmol/L (ref 135–145)
Sodium: 138 mmol/L (ref 135–145)
Sodium: 136 mmol/L (ref 135–145)

## 2018-10-03 LAB — GLUCOSE, CAPILLARY
Glucose-Capillary: 168 mg/dL — ABNORMAL HIGH (ref 70–99)
Glucose-Capillary: 148 mg/dL — ABNORMAL HIGH (ref 70–99)
Glucose-Capillary: 153 mg/dL — ABNORMAL HIGH (ref 70–99)
Glucose-Capillary: 193 mg/dL — ABNORMAL HIGH (ref 70–99)
Glucose-Capillary: 184 mg/dL — ABNORMAL HIGH (ref 70–99)

## 2018-10-03 LAB — CBG MONITORING, ED
Glucose-Capillary: 251 mg/dL — ABNORMAL HIGH (ref 70–99)
Glucose-Capillary: 308 mg/dL — ABNORMAL HIGH (ref 70–99)
Glucose-Capillary: 247 mg/dL — ABNORMAL HIGH (ref 70–99)
Glucose-Capillary: 273 mg/dL — ABNORMAL HIGH (ref 70–99)
Glucose-Capillary: 248 mg/dL — ABNORMAL HIGH (ref 70–99)
Glucose-Capillary: 256 mg/dL — ABNORMAL HIGH (ref 70–99)
Glucose-Capillary: 168 mg/dL — ABNORMAL HIGH (ref 70–99)
Glucose-Capillary: 181 mg/dL — ABNORMAL HIGH (ref 70–99)
Glucose-Capillary: 168 mg/dL — ABNORMAL HIGH (ref 70–99)
Glucose-Capillary: 179 mg/dL — ABNORMAL HIGH (ref 70–99)
Glucose-Capillary: 191 mg/dL — ABNORMAL HIGH (ref 70–99)
Glucose-Capillary: 172 mg/dL — ABNORMAL HIGH (ref 70–99)
Glucose-Capillary: 189 mg/dL — ABNORMAL HIGH (ref 70–99)
Glucose-Capillary: 184 mg/dL — ABNORMAL HIGH (ref 70–99)

## 2018-10-03 LAB — COMPREHENSIVE METABOLIC PANEL
ALT: 47 U/L — ABNORMAL HIGH (ref 0–44)
AST: 56 U/L — ABNORMAL HIGH (ref 15–41)
Albumin: 4 g/dL (ref 3.5–5.0)
Alkaline Phosphatase: 134 U/L — ABNORMAL HIGH (ref 38–126)
Anion gap: 24 — ABNORMAL HIGH (ref 5–15)
BUN: 15 mg/dL (ref 6–20)
CO2: 7 mmol/L — ABNORMAL LOW (ref 22–32)
Calcium: 9.1 mg/dL (ref 8.9–10.3)
Chloride: 101 mmol/L (ref 98–111)
Creatinine, Ser: 1.32 mg/dL — ABNORMAL HIGH (ref 0.61–1.24)
GFR calc Af Amer: >60 mL/min (ref >60)
GFR calc non Af Amer: >60 mL/min (ref >60)
Glucose, Bld: 361 mg/dL — ABNORMAL HIGH (ref 70–99)
Potassium: 5.6 mmol/L — ABNORMAL HIGH (ref 3.5–5.1)
Sodium: 132 mmol/L — ABNORMAL LOW (ref 135–145)
Total Bilirubin: 3.2 mg/dL — ABNORMAL HIGH (ref 0.3–1.2)
Total Protein: 8 g/dL (ref 6.5–8.1)

## 2018-10-03 LAB — LACTIC ACID, PLASMA: Lactic Acid, Venous: 1.5 mmol/L (ref 0.5–1.9)

## 2018-10-03 LAB — SARS CORONAVIRUS 2 (TAT 6-24 HRS): SARS Coronavirus 2: NEGATIVE

## 2018-10-03 LAB — CBC WITH DIFFERENTIAL/PLATELET
Abs Immature Granulocytes: 0.11 10*3/uL — ABNORMAL HIGH (ref 0.00–0.07)
Basophils Absolute: 0.1 10*3/uL (ref 0.0–0.1)
Basophils Relative: 1 %
Eosinophils Absolute: 0.1 10*3/uL (ref 0.0–0.5)
Eosinophils Relative: 0 %
HCT: 42.9 % (ref 39.0–52.0)
Hemoglobin: 15.1 g/dL (ref 13.0–17.0)
Immature Granulocytes: 1 %
Lymphocytes Relative: 14 %
Lymphs Abs: 2.4 10*3/uL (ref 0.7–4.0)
MCH: 31.3 pg (ref 26.0–34.0)
MCHC: 35.2 g/dL (ref 30.0–36.0)
MCV: 89 fL (ref 80.0–100.0)
Monocytes Absolute: 1 10*3/uL (ref 0.1–1.0)
Monocytes Relative: 6 %
Neutro Abs: 14.2 10*3/uL — ABNORMAL HIGH (ref 1.7–7.7)
Neutrophils Relative %: 78 %
Platelets: 333 10*3/uL (ref 150–400)
RBC: 4.82 MIL/uL (ref 4.22–5.81)
RDW: 14.6 % (ref 11.5–15.5)
WBC: 17.9 10*3/uL — ABNORMAL HIGH (ref 4.0–10.5)
nRBC: 0 % (ref 0.0–0.2)

## 2018-10-03 LAB — URINALYSIS, ROUTINE W REFLEX MICROSCOPIC
Bilirubin Urine: NEGATIVE
Glucose, UA: >=500 mg/dL — AB
Ketones, ur: 80 mg/dL — AB
Leukocytes,Ua: NEGATIVE
Nitrite: NEGATIVE
Protein, ur: 100 mg/dL — AB
Specific Gravity, Urine: 1.024 (ref 1.005–1.030)
pH: 5 (ref 5.0–8.0)

## 2018-10-03 LAB — HIV ANTIBODY (ROUTINE TESTING W REFLEX): HIV Screen 4th Generation wRfx: NONREACTIVE

## 2018-10-03 LAB — PROTIME-INR
INR: 1 (ref 0.8–1.2)
Prothrombin Time: 13 seconds (ref 11.4–15.2)

## 2018-10-03 LAB — HEMOGLOBIN A1C
Hgb A1c MFr Bld: 14.4 % — ABNORMAL HIGH (ref 4.8–5.6)
Mean Plasma Glucose: 366.58 mg/dL

## 2018-10-03 LAB — APTT: aPTT: 36 seconds (ref 24–36)

## 2018-10-03 MED ORDER — SODIUM CHLORIDE 0.9 % IV SOLN: INTRAVENOUS | Status: DC

## 2018-10-03 MED ORDER — DEXTROSE-NACL 5-0.45 % IV SOLN: INTRAVENOUS | Status: DC

## 2018-10-03 MED ORDER — INSULIN REGULAR BOLUS VIA INFUSION
0.0000 [IU] | Freq: Three times a day (TID) | INTRAVENOUS | Status: DC
  Administered 2018-10-04: 7.7 [IU] via INTRAVENOUS
  Filled 2018-10-03: qty 10

## 2018-10-03 MED ORDER — HYDRALAZINE HCL 25 MG PO TABS: 25.0000 mg | ORAL_TABLET | Freq: Three times a day (TID) | ORAL | Status: DC | PRN

## 2018-10-03 MED ORDER — ENOXAPARIN SODIUM 40 MG/0.4ML ~~LOC~~ SOLN
40.0000 mg | SUBCUTANEOUS | Status: DC
  Administered 2018-10-04: 40 mg via SUBCUTANEOUS
  Filled 2018-10-03 (×2): qty 0.4

## 2018-10-03 MED ORDER — ACETAMINOPHEN 325 MG PO TABS
650.0000 mg | ORAL_TABLET | Freq: Four times a day (QID) | ORAL | Status: DC | PRN
  Administered 2018-10-03 – 2018-10-04 (×2): 650 mg via ORAL
  Filled 2018-10-03 (×2): qty 2

## 2018-10-03 MED ORDER — LIVING WELL WITH DIABETES BOOK
Freq: Once | Status: DC
  Filled 2018-10-03 (×3): qty 1

## 2018-10-03 MED ORDER — DEXTROSE-NACL 5-0.45 % IV SOLN
INTRAVENOUS | Status: DC
  Administered 2018-10-03 – 2018-10-04 (×4): via INTRAVENOUS

## 2018-10-03 MED ORDER — ONDANSETRON HCL 4 MG/2ML IJ SOLN
4.0000 mg | Freq: Three times a day (TID) | INTRAMUSCULAR | Status: DC | PRN
  Administered 2018-10-03: 4 mg via INTRAVENOUS
  Filled 2018-10-03: qty 2

## 2018-10-03 MED ORDER — INSULIN REGULAR(HUMAN) IN NACL 100-0.9 UT/100ML-% IV SOLN
INTRAVENOUS | Status: AC
  Administered 2018-10-03: 1.9 [IU]/h via INTRAVENOUS
  Administered 2018-10-03: 12.4 [IU]/h via INTRAVENOUS
  Administered 2018-10-04: 7.9 [IU]/h via INTRAVENOUS
  Filled 2018-10-03 (×4): qty 100

## 2018-10-03 MED ORDER — SODIUM CHLORIDE 0.9 % IV BOLUS
1000.0000 mL | Freq: Once | INTRAVENOUS | Status: AC
  Administered 2018-10-03: 1000 mL via INTRAVENOUS

## 2018-10-03 MED ORDER — NICOTINE 21 MG/24HR TD PT24: 21.0000 mg | MEDICATED_PATCH | Freq: Every day | TRANSDERMAL | Status: DC

## 2018-10-03 MED ORDER — ONDANSETRON HCL 4 MG/2ML IJ SOLN
4.0000 mg | Freq: Once | INTRAMUSCULAR | Status: AC
  Administered 2018-10-03: 4 mg via INTRAVENOUS
  Filled 2018-10-03: qty 2

## 2018-10-03 MED ORDER — DEXTROSE 50 % IV SOLN: 25.0000 mL | INTRAVENOUS | Status: DC | PRN

## 2018-10-03 NOTE — ED Notes (Signed)
SDU 

## 2018-10-03 NOTE — H&P (Signed)
History and Physical    Coden Franchi TMH:[id number] DOB: January 29, 1985 DOA: 10/02/2018  Referring MD/NP/PA:   PCP: Patient, No Pcp Per   Patient coming from:  The patient is coming from home.  At baseline, pt is independent for most of ADL.        Chief Complaint: Polyuria, polydipsia, generalized weakness  HPI: Stephfon Bovey is a 33 y.o. male with medical history significant of hypertension, former smoker, DKA, medication noncompliance, who presents with polyuria, polydipsia, generalized weakness.  Patient states that he has been feeling bad in the past several days. He has generalized weakness fatigue, low energy.  He has increased urinary frequency and feeling thirsty all the time.  No dysuria or burning on urination.  Patient denies nausea vomiting, diarrhea or abdominal pain.  No chest pain, shortness of breath, cough, fever or chills.  No symptoms of UTI.  Patient is not taking medications for diabetes high blood pressure currently.  ED Course: pt was found to have WBC 17.8, blood sugar 361, bicarbonate of 7, anion gap 24, pending urinalysis, lactic acid 1.5, INR 1.0, PTT 36, pending COVID-19 test, potassium 5.6, creatinine 1.32, BUN 15, GFR>60, temperature normal, blood pressure 148/87, tachycardia, tachypnea, oxygen saturation 96% on room air, chest x-ray negative.  Patient is admitted to stepdown as inpatient.  Review of Systems:   General: no fevers, chills, no body weight gain, has fatigue HEENT: no blurry vision, hearing changes or sore throat Respiratory: no dyspnea, coughing, wheezing CV: no chest pain, no palpitations GI: no nausea, vomiting, abdominal pain, diarrhea, constipation GU: no dysuria, burning on urination, increased urinary frequency, hematuria  Ext: no leg edema Neuro: no unilateral weakness, numbness, or tingling, no vision change or hearing loss Skin: no rash, no skin tear. MSK: No muscle spasm, no deformity, no limitation of range  of movement in spin Heme: No easy bruising.  Travel history: No recent long distant travel.  Allergy: No Known Allergies  Past Medical History:  Diagnosis Date  . Back pain   . Diabetes mellitus without complication ([place])   . Hypertension     History reviewed. No pertinent surgical history.  Social History:  reports that he has quit smoking. He has never used smokeless tobacco. He reports current alcohol use. He reports that he does not use drugs.  Family History: Reviewed with patient, but patient does not know any family medical history.  Prior to Admission medications   Medication Sig Start Date End Date Taking? Authorizing Provider  blood glucose meter kit and supplies KIT Dispense based on patient and insurance preference. Use up to four times daily as directed. (FOR ICD-9 250.00, 250.01). 05/12/17   Aline August, MD  insulin NPH-regular Human (NOVOLIN 70/30 RELION) (70-30) 100 UNIT/ML injection Inject 20 Units into the skin 2 (two) times daily with a meal. Patient not taking: Reported on 10/02/2018 05/12/17   Aline August, MD  Insulin Syringe-Needle U-100 (INSULIN SYRINGE .5CC/31GX5/16") 31G X 5/16" 0.5 ML MISC 70/30 20 units bid 05/12/17   Aline August, MD    Physical Exam: Vitals:   10/03/18 0207 10/03/18 0209 10/03/18 0215 10/03/18 0230  BP:   (!) 146/75 138/71  Pulse: (!) 102 (!) 102 (!) 101 98  Resp: 19 20 (!) 22 (!) 21  Temp:      TempSrc:      SpO2: 99% 98% 98% 97%  Weight:      Height:       General: Not in acute distress.  Dry  mucus membrane HEENT:       Eyes: PERRL, EOMI, no scleral icterus.       ENT: No discharge from the ears and nose, no pharynx injection, no tonsillar enlargement.        Neck: No JVD, no bruit, no mass felt. Heme: No neck lymph node enlargement. Cardiac: S1/S2, RRR, No murmurs, No gallops or rubs. Respiratory:  No rales, wheezing, rhonchi or rubs. GI: Soft, nondistended, nontender, no rebound pain, no organomegaly, BS present.  GU: No hematuria Ext: No pitting leg edema bilaterally. 2+DP/PT pulse bilaterally. Musculoskeletal: No joint deformities, No joint redness or warmth, no limitation of ROM in spin. Skin: No rashes.  Neuro: Alert, oriented X3, cranial nerves II-XII grossly intact, moves all extremities normally.  Psych: Patient is not psychotic, no suicidal or hemocidal ideation.  Labs on Admission: I have personally reviewed following labs and imaging studies  CBC: Recent Labs  Lab 10/02/18 2257 10/02/18 2301  WBC 17.8*  --   NEUTROABS 14.8*  --   HGB 19.0* 18.0*  HCT 50.9 53.0*  MCV 88.2  --   PLT 342  --    Basic Metabolic Panel: Recent Labs  Lab 10/02/18 2301 10/02/18 2345  NA 131* 132*  K 4.6 5.6*  CL 108 101  CO2  --  7*  GLUCOSE 387* 361*  BUN 18 15  CREATININE 0.60* 1.32*  CALCIUM  --  9.1   GFR: Estimated Creatinine Clearance: 105.3 mL/min (A) (by C-G formula based on SCr of 1.32 mg/dL (H)). Liver Function Tests: Recent Labs  Lab 10/02/18 2345  AST 56*  ALT 47*  ALKPHOS 134*  BILITOT 3.2*  PROT 8.0  ALBUMIN 4.0   No results for input(s): LIPASE, AMYLASE in the last 168 hours. No results for input(s): AMMONIA in the last 168 hours. Coagulation Profile: Recent Labs  Lab 10/03/18 0046  INR 1.0   Cardiac Enzymes: No results for input(s): CKTOTAL, CKMB, CKMBINDEX, TROPONINI in the last 168 hours. BNP (last 3 results) No results for input(s): PROBNP in the last 8760 hours. HbA1C: No results for input(s): HGBA1C in the last 72 hours. CBG: Recent Labs  Lab 10/02/18 2247 10/03/18 0149  GLUCAP 369* 251*   Lipid Profile: No results for input(s): CHOL, HDL, LDLCALC, TRIG, CHOLHDL, LDLDIRECT in the last 72 hours. Thyroid Function Tests: No results for input(s): TSH, T4TOTAL, FREET4, T3FREE, THYROIDAB in the last 72 hours. Anemia Panel: No results for input(s): VITAMINB12, FOLATE, FERRITIN, TIBC, IRON, RETICCTPCT in the last 72 hours. Urine analysis:    Component  Value Date/Time   COLORURINE STRAW (A) 10/03/2018 0218   APPEARANCEUR CLEAR 10/03/2018 0218   LABSPEC 1.024 10/03/2018 0218   PHURINE 5.0 10/03/2018 0218   GLUCOSEU >=500 (A) 10/03/2018 0218   HGBUR MODERATE (A) 10/03/2018 0218   BILIRUBINUR NEGATIVE 10/03/2018 0218   KETONESUR 80 (A) 10/03/2018 0218   PROTEINUR 100 (A) 10/03/2018 0218   NITRITE NEGATIVE 10/03/2018 0218   LEUKOCYTESUR NEGATIVE 10/03/2018 0218   Sepsis Labs: @LABRCNTIP (procalcitonin:4,lacticidven:4) )No results found for this or any previous visit (from the past 240 hour(s)).   Radiological Exams on Admission: Dg Chest Port 1 View  Result Date: 10/02/2018 CLINICAL DATA:  Shortness of breath EXAM: PORTABLE CHEST 1 VIEW COMPARISON:  None. FINDINGS: The heart size and mediastinal contours are within normal limits. Both lungs are clear. The visualized skeletal structures are unremarkable. IMPRESSION: No acute cardiopulmonary process. Electronically Signed   By: Prudencio Pair M.D.   On: 10/02/2018 22:42  EKG: Independently reviewed.  Sinus rhythm, tachycardia, QTC 391, T wave inversion in inferior leads and V4-V6.   Assessment/Plan Principal Problem:   DKA (diabetic ketoacidoses) (HCC) Active Problems:   Hypertension   Leukocytosis   Hyperkalemia   Diabetes mellitus without complication (HCC)   DKA (diabetic ketoacidoses) ([place]): blood sugar 361, bicarbonate of 7, anion gap 24, pending urinalysis for ketone.  This is due to medication noncompliance.  Patient is not taking his NPH insulin currently.  - Admit to stepdown  - 3L of NS bolus - start DKA protocol with BMP q4h - IVF: NS 125 cc/h; will switch to D5-1/2NS when CBG<250 - replete K as needed - Zofran prn nausea  - NPO  - consult to diabetic educator and CM  Leukocytosis: no fever, no signs of infection. Likely due to DKA. Pt received 1 dose of cefepime, Flagyl and vancomycin in ED, will not continue antibiotics. -Will follow up blood and urine  culture and UA -follow up by CBC  Diabetes mellitus without complication ([place]): H3Z 16.9 on 05/10/2017, poorly controlled.  Patient is supposed to take NPH insulin, but has not taken his medications currently. -On DKA protocol now -Diabetic educator consult  Hypertension: Blood pressure 148/87, not taking medications at home. -prn oral hydralazine  Hyperkalemia: K 5.6 -expect correction with insulin gtt   Inpatient status:  # Patient requires inpatient status due to high intensity of service, high risk for further deterioration and high frequency of surveillance required.  I certify that at the point of admission it is my clinical judgment that the patient will require inpatient hospital care spanning beyond 2 midnights from the point of admission.  . This patient has multiple chronic comorbidities including hypertension, tobacco abuse, DKA, medication noncompliance . Now patient has presenting with DKA  . The worrisome physical exam findings include dry mucous membrane . The initial radiographic and laboratory data are worrisome because of elevated blood sugar, DKA with anion gap 24 and bicarbonate of 7, leukocytosis, hyperkalemia . Current medical needs: please see my assessment and plan . Predictability of an adverse outcome (risk): Patient has multiple comorbidities and also has medication noncompliance, who presents with DKA with anion gap 24 and bicarbonate of 7.  His presentation is highly complicated.  Patient is at high risk for deteriorating.  Will need to be treated in hospital for at least 2 days.    DVT ppx: SQ Lovenox Code Status: Full code Family Communication: None at bed side.   Disposition Plan:  Anticipate discharge back to previous home environment Consults called:  none Admission status:   SDU/inpation       Date of Service 10/03/2018    [facility]   If 7PM-7AM, please contact night-coverage [email or web address] Password [email or web address] 10/03/2018, 3:10  AM

## 2018-10-03 NOTE — ED Notes (Signed)
zofran given for vomiting

## 2018-10-03 NOTE — Progress Notes (Signed)
Patient seen and examined, admitted earlier this morning by Dr. Niu -this is a 32-year-old Hispanic male, who was admitted with DKA in April, was newly diagnosed with type 2 diabetes then, hemoglobin A1c was 13, discharged home on insulin 70/30, apparently she stopped taking his meds because he thought he did not need this anymore, unfortunately he could not be set up with a follow-up at [place] [facility] prior to discharge last time. -Back in the ED with symptoms from DKA -On IV fluids and insulin drip at this time -Awaiting labs, unfortunately his every 4 hours B met has not been drawn, discussed with RN awaiting repeat labs to determine if acidosis is improving -His leukocytosis is likely reactive from DKA and dehydration -COVID PCR is negative -Diabetes coordinator and case management consult will need follow-up made at [place] [facility] prior to discharge this time  Preetha Joseph, MD 

## 2018-10-03 NOTE — ED Notes (Signed)
Breakfast ordered 

## 2018-10-03 NOTE — ED Notes (Signed)
Pt vomiting. zofran given

## 2018-10-03 NOTE — Care Management (Signed)
ED CM noted patient to be admitted back in April with DKA new onset diabetes. Patient was supposed to follow up with [facility] however did not and stopped taking his meds. Met with patient at bedside and discussed the [facility] and the services they provide, patient is agreeable with f/u at the clinic. CM will notify the clinic's CM to follow up with patient to establish a f/u appointment prior to discharge from hospital.

## 2018-10-03 NOTE — ED Provider Notes (Signed)
Patient presents with generalized weakness and not feeling well.  History of diabetes but noncompliant with insulin.  Does have 1 admission for DKA.  Was afebrile on arrival.  However he was tachycardic.  Sepsis work-up initiated [place], [place].  He was notably hyperglycemic.  He also appeared clinically very dry.  He was given fluids.  He denies any infectious symptoms.  On my review of lab work he appears very hemoconcentrated and dry.  Additional 1 L of fluid was ordered for a total of 3 L.  He does have an anion gap of 24.  Suspect DKA from noncompliance.  He was started on glucose stabilizer.  He does have a leukocytosis to 17.8 but this is in the setting of significant dehydration.  Low suspicion at this time for sepsis.  He was initially given broad-spectrum antibiotics but would elect to hold further antibiotics.  Lactate was normal.    Physical Exam  BP 129/85   Pulse (!) 107   Temp 98.4 F (36.9 C) (Oral)   Resp 17   Ht 1.727 m (5\' 8" )   Wt 127 kg   SpO2 100%   BMI 42.57 kg/m   Physical Exam Awake, alert, no acute distress Obese Good perfusion  ED Course/Procedures     Procedures  MDM   Patient presents with generalized weakness and not feeling well.  Suspect DKA as the cause of the patient's symptoms.  Reports that he has not been on insulin in recent history but chart review reveals that he was admitted to the hospital in April 2019 and discharged with insulin.  Low suspicion for infectious etiology.  He is clinically very dry.  He has received 3 L of normal saline and started on glucose stabilizer.  Plan for admission to the admitting hospitalist.  CRITICAL CARE Performed by: Merryl Hacker   Total critical care time: 31 minutes  Critical care time was exclusive of separately billable procedures and treating other patients.  Critical care was necessary to treat or prevent imminent or life-threatening deterioration.  Critical care was time spent personally by me  on the following activities: development of treatment plan with patient and/or surrogate as well as nursing, discussions with consultants, evaluation of patient's response to treatment, examination of patient, obtaining history from patient or surrogate, ordering and performing treatments and interventions, ordering and review of laboratory studies, ordering and review of radiographic studies, pulse oximetry and re-evaluation of patient's condition.        Merryl Hacker, MD 10/03/18 [phone]

## 2018-10-03 NOTE — ED Notes (Signed)
Pt sleeping soundly at present

## 2018-10-03 NOTE — Progress Notes (Signed)
Received Robert Ballard from ED.  CHG bath given.  Oriented to room.IVF infusing D%1/3 ns at 125 cc/ hr and Insulin at 12.5 units per hour in the Left hand.  Site unremarkable.

## 2018-10-03 NOTE — Progress Notes (Signed)
Inpatient Diabetes Program Recommendations  AACE/ADA: New Consensus Statement on Inpatient Glycemic Control (2015)  Target Ranges:  Prepandial:   less than 140 mg/dL      Peak postprandial:   less than 180 mg/dL (1-2 hours)      Critically ill patients:  140 - 180 mg/dL   Lab Results  Component Value Date   GLUCAP 168 (H) 10/03/2018   HGBA1C 14.4 (H) 10/03/2018    Review of Glycemic Control Results for OSBY, SWEETIN (MRN [id number]) as of 10/03/2018 10:07  Ref. Range 10/03/2018 05:50 10/03/2018 06:44 10/03/2018 08:12 10/03/2018 09:22  Glucose-Capillary Latest Ref Range: 70 - 99 mg/dL 273 (H) 248 (H) 256 (H) 168 (H)    Outpatient Diabetes medications: Novolin 70/30 20 units BID (not taking) Current orders for Inpatient glycemic control: IV insulin  Inpatient Diabetes Program Recommendations:    Noted consult. BMET Q4H just ordered. Will follow.  Would not recommend transition at this time.   Of note, patient was admitted last in April 2019 for DKA and DM coordinators spoke with patient on 4/28 & 4/29.   Will order LWWDM book in [place], dietitian and CM consult. Will plan to speak with patient when admitted.   Thanks, Bronson Curb, MSN, RNC-OB Diabetes Coordinator [phone] (8a-5p)

## 2018-10-04 LAB — BASIC METABOLIC PANEL
Anion gap: 5 (ref 5–15)
Anion gap: 9 (ref 5–15)
Anion gap: 8 (ref 5–15)
BUN: 6 mg/dL (ref 6–20)
BUN: 6 mg/dL (ref 6–20)
BUN: 5 mg/dL — ABNORMAL LOW (ref 6–20)
CO2: 19 mmol/L — ABNORMAL LOW (ref 22–32)
CO2: 16 mmol/L — ABNORMAL LOW (ref 22–32)
CO2: 17 mmol/L — ABNORMAL LOW (ref 22–32)
Calcium: 8.2 mg/dL — ABNORMAL LOW (ref 8.9–10.3)
Calcium: 8.2 mg/dL — ABNORMAL LOW (ref 8.9–10.3)
Calcium: 8.4 mg/dL — ABNORMAL LOW (ref 8.9–10.3)
Chloride: 108 mmol/L (ref 98–111)
Chloride: 108 mmol/L (ref 98–111)
Chloride: 110 mmol/L (ref 98–111)
Creatinine, Ser: 0.56 mg/dL — ABNORMAL LOW (ref 0.61–1.24)
Creatinine, Ser: 0.63 mg/dL (ref 0.61–1.24)
Creatinine, Ser: 0.56 mg/dL — ABNORMAL LOW (ref 0.61–1.24)
GFR calc Af Amer: >60 mL/min (ref >60)
GFR calc Af Amer: >60 mL/min (ref >60)
GFR calc Af Amer: >60 mL/min (ref >60)
GFR calc non Af Amer: >60 mL/min (ref >60)
GFR calc non Af Amer: >60 mL/min (ref >60)
GFR calc non Af Amer: >60 mL/min (ref >60)
Glucose, Bld: 147 mg/dL — ABNORMAL HIGH (ref 70–99)
Glucose, Bld: 149 mg/dL — ABNORMAL HIGH (ref 70–99)
Glucose, Bld: 122 mg/dL — ABNORMAL HIGH (ref 70–99)
Potassium: 3.3 mmol/L — ABNORMAL LOW (ref 3.5–5.1)
Potassium: 3.4 mmol/L — ABNORMAL LOW (ref 3.5–5.1)
Potassium: 3.6 mmol/L (ref 3.5–5.1)
Sodium: 132 mmol/L — ABNORMAL LOW (ref 135–145)
Sodium: 133 mmol/L — ABNORMAL LOW (ref 135–145)
Sodium: 135 mmol/L (ref 135–145)

## 2018-10-04 LAB — URINE CULTURE: Culture: <10000 — AB

## 2018-10-04 LAB — GLUCOSE, CAPILLARY
Glucose-Capillary: 129 mg/dL — ABNORMAL HIGH (ref 70–99)
Glucose-Capillary: 133 mg/dL — ABNORMAL HIGH (ref 70–99)
Glucose-Capillary: 141 mg/dL — ABNORMAL HIGH (ref 70–99)
Glucose-Capillary: 142 mg/dL — ABNORMAL HIGH (ref 70–99)
Glucose-Capillary: 152 mg/dL — ABNORMAL HIGH (ref 70–99)
Glucose-Capillary: 154 mg/dL — ABNORMAL HIGH (ref 70–99)
Glucose-Capillary: 158 mg/dL — ABNORMAL HIGH (ref 70–99)
Glucose-Capillary: 160 mg/dL — ABNORMAL HIGH (ref 70–99)
Glucose-Capillary: 170 mg/dL — ABNORMAL HIGH (ref 70–99)
Glucose-Capillary: 172 mg/dL — ABNORMAL HIGH (ref 70–99)
Glucose-Capillary: 205 mg/dL — ABNORMAL HIGH (ref 70–99)
Glucose-Capillary: 218 mg/dL — ABNORMAL HIGH (ref 70–99)
Glucose-Capillary: 246 mg/dL — ABNORMAL HIGH (ref 70–99)

## 2018-10-04 LAB — CBC
HCT: 41.2 % (ref 39.0–52.0)
Hemoglobin: 13.8 g/dL (ref 13.0–17.0)
MCH: 29.1 pg (ref 26.0–34.0)
MCHC: 33.5 g/dL (ref 30.0–36.0)
MCV: 86.7 fL (ref 80.0–100.0)
Platelets: 261 10*3/uL (ref 150–400)
RBC: 4.75 MIL/uL (ref 4.22–5.81)
RDW: 14.6 % (ref 11.5–15.5)
WBC: 15.9 10*3/uL — ABNORMAL HIGH (ref 4.0–10.5)
nRBC: 0 % (ref 0.0–0.2)

## 2018-10-04 MED ORDER — INSULIN ASPART 100 UNIT/ML ~~LOC~~ SOLN
0.0000 [IU] | Freq: Three times a day (TID) | SUBCUTANEOUS | Status: DC
  Administered 2018-10-04 – 2018-10-05 (×3): 5 [IU] via SUBCUTANEOUS
  Administered 2018-10-05: 11 [IU] via SUBCUTANEOUS

## 2018-10-04 MED ORDER — SODIUM CHLORIDE 0.9 % IV SOLN
INTRAVENOUS | Status: DC
  Administered 2018-10-04: 08:00:00 via INTRAVENOUS

## 2018-10-04 MED ORDER — INSULIN GLARGINE 100 UNIT/ML ~~LOC~~ SOLN
30.0000 [IU] | Freq: Every day | SUBCUTANEOUS | Status: DC
  Filled 2018-10-04: qty 0.3

## 2018-10-04 MED ORDER — INSULIN GLARGINE 100 UNIT/ML ~~LOC~~ SOLN
20.0000 [IU] | Freq: Every day | SUBCUTANEOUS | Status: DC
  Administered 2018-10-04: 20 [IU] via SUBCUTANEOUS
  Filled 2018-10-04: qty 0.2

## 2018-10-04 MED ORDER — INSULIN ASPART 100 UNIT/ML ~~LOC~~ SOLN
0.0000 [IU] | Freq: Every day | SUBCUTANEOUS | Status: DC
  Administered 2018-10-04: 2 [IU] via SUBCUTANEOUS

## 2018-10-04 MED ORDER — POTASSIUM CHLORIDE CRYS ER 20 MEQ PO TBCR
40.0000 meq | EXTENDED_RELEASE_TABLET | Freq: Once | ORAL | Status: AC
  Administered 2018-10-04: 40 meq via ORAL
  Filled 2018-10-04: qty 2

## 2018-10-04 NOTE — Progress Notes (Signed)
PROGRESS NOTE    Robert Ballard  QIW:[id number] DOB: [birth year] DOA: 10/02/2018 PCP: Patient, No Pcp Per  Brief Narrative:33 year old Hispanic male, who was admitted with DKA in April, was newly diagnosed with type 2 diabetes then, hemoglobin A1c was 13, discharged home on insulin 70/30, apparently she stopped taking his meds because he thought he did not need this anymore, unfortunately he could not be set up with a follow-up at [facility] prior to discharge last time. -Back in the ED with DKA   Assessment & Plan:   Diabetic ketoacidosis -Anion gap is corrected transition to Lantus and sliding scale insulin -Hemoglobin A1c is 14.4 -Diabetes coordinator consult -Diet education -Case management consult needs follow-up set up at [facility] wellness clinic  Hyperkalemia -Due to AKI, resolved  Morbid obesity, BMI 42 -RD consult, diet education  Hypertension -Blood pressure trending on the higher side, stop IV fluids, if remains elevated will add low-dose ACE inhibitor at discharge  DVT prophylaxis: Lovenox Code Status: Full code Family Communication: No family at bedside Disposition Plan: Home tomorrow if stable  Consultants:    Procedures:   Antimicrobials:    Subjective: -Feels well, finally happy about being able to eat, -Denies problems with getting medications, tells me that he thought he did not need the medicines after using it for short while  Objective: Vitals:   10/04/18 0000 10/04/18 0301 10/04/18 0745 10/04/18 1142  BP:  (!) 143/91 (!) 149/92 (!) 143/85  Pulse:  95 92 97  Resp:  16 11 18   Temp: 98.8 F (37.1 C) 98.4 F (36.9 C) 98.4 F (36.9 C) 98.4 F (36.9 C)  TempSrc: Oral Oral Oral Oral  SpO2:  98% 97% 98%  Weight:      Height:        Intake/Output Summary (Last 24 hours) at 10/04/2018 1223 Last data filed at 10/04/2018 0600 Gross per 24 hour  Intake 3663.65 ml  Output 2000 ml  Net 1663.65 ml   Filed Weights    10/02/18 2227  Weight: 127 kg    Examination:  General exam: Appears calm and comfortable obese male, no distress Respiratory system: Clear to auscultation Cardiovascular system: S1 & S2 heard, RRR  Gastrointestinal system: Abdomen is nondistended, soft and nontender.Normal bowel sounds heard. Central nervous system: Alert and oriented. No focal neurological deficits. Extremities: No edema Skin: No rashes, lesions or ulcers Psychiatry:Mood & affect appropriate.     Data Reviewed:   CBC: Recent Labs  Lab 10/02/18 2257 10/02/18 2301 10/03/18 1150 10/04/18 0259  WBC 17.8*  --  17.9* 15.9*  NEUTROABS 14.8*  --  14.2*  --   HGB 19.0* 18.0* 15.1 13.8  HCT 50.9 53.0* 42.9 41.2  MCV 88.2  --  89.0 86.7  PLT 342  --  333 261   Basic Metabolic Panel: Recent Labs  Lab 10/03/18 1750 10/03/18 2058 10/04/18 0040 10/04/18 0259 10/04/18 0942  NA 136 133* 132* 133* 135  K 4.9 4.6 3.3* 3.4* 3.6  CL 108 107 108 108 110  CO2 13* 18* 19* 16* 17*  GLUCOSE 186* 157* 147* 149* 122*  BUN 7 7 6 6  5*  CREATININE 0.89 0.65 0.56* 0.63 0.56*  CALCIUM 8.4* 8.2* 8.2* 8.2* 8.4*   GFR: Estimated Creatinine Clearance: 173.7 mL/min (A) (by C-G formula based on SCr of 0.56 mg/dL (L)). Liver Function Tests: Recent Labs  Lab 10/02/18 2345  AST 56*  ALT 47*  ALKPHOS 134*  BILITOT 3.2*  PROT 8.0  ALBUMIN 4.0   No results for input(s): LIPASE, AMYLASE in the last 168 hours. No results for input(s): AMMONIA in the last 168 hours. Coagulation Profile: Recent Labs  Lab 10/03/18 0046  INR 1.0   Cardiac Enzymes: No results for input(s): CKTOTAL, CKMB, CKMBINDEX, TROPONINI in the last 168 hours. BNP (last 3 results) No results for input(s): PROBNP in the last 8760 hours. HbA1C: Recent Labs    10/03/18 0330  HGBA1C 14.4*   CBG: Recent Labs  Lab 10/04/18 0604 10/04/18 0644 10/04/18 0743 10/04/18 0955 10/04/18 1136  GLUCAP 170* 172* 160* 154* 205*   Lipid Profile: No  results for input(s): CHOL, HDL, LDLCALC, TRIG, CHOLHDL, LDLDIRECT in the last 72 hours. Thyroid Function Tests: No results for input(s): TSH, T4TOTAL, FREET4, T3FREE, THYROIDAB in the last 72 hours. Anemia Panel: No results for input(s): VITAMINB12, FOLATE, FERRITIN, TIBC, IRON, RETICCTPCT in the last 72 hours. Urine analysis:    Component Value Date/Time   COLORURINE STRAW (A) 10/03/2018 0218   APPEARANCEUR CLEAR 10/03/2018 0218   LABSPEC 1.024 10/03/2018 0218   PHURINE 5.0 10/03/2018 0218   GLUCOSEU >=500 (A) 10/03/2018 0218   HGBUR MODERATE (A) 10/03/2018 0218   BILIRUBINUR NEGATIVE 10/03/2018 0218   KETONESUR 80 (A) 10/03/2018 0218   PROTEINUR 100 (A) 10/03/2018 0218   NITRITE NEGATIVE 10/03/2018 0218   LEUKOCYTESUR NEGATIVE 10/03/2018 0218   Sepsis Labs: @LABRCNTIP (procalcitonin:4,lacticidven:4)  ) Recent Results (from the past 240 hour(s))  SARS CORONAVIRUS 2 (TAT 6-24 HRS) Nasopharyngeal Nasopharyngeal Swab     Status: None   Collection Time: 10/02/18 11:23 PM   Specimen: Nasopharyngeal Swab  Result Value Ref Range Status   SARS Coronavirus 2 NEGATIVE NEGATIVE Final    Comment: (NOTE) SARS-CoV-2 target nucleic acids are NOT DETECTED. The SARS-CoV-2 RNA is generally detectable in upper and lower respiratory specimens during the acute phase of infection. Negative results do not preclude SARS-CoV-2 infection, do not rule out co-infections with other pathogens, and should not be used as the sole basis for treatment or other patient management decisions. Negative results must be combined with clinical observations, patient history, and epidemiological information. The expected result is Negative. Fact Sheet for Patients: SugarRoll.be Fact Sheet for Healthcare Providers: https://www.woods-mathews.com/ This test is not yet approved or cleared by the [place] FDA and  has been authorized for detection and/or diagnosis of  SARS-CoV-2 by FDA under an Emergency Use Authorization (EUA). This EUA will remain  in effect (meaning this test can be used) for the duration of the COVID-19 declaration under Section 56 4(b)(1) of the Act, 21 U.S.C. section 360bbb-3(b)(1), unless the authorization is terminated or revoked sooner. Performed at [facility], [place] [address], [place], [place] [address]   Urine culture     Status: Abnormal   Collection Time: 10/03/18  2:15 AM   Specimen: In/Out Cath Urine  Result Value Ref Range Status   Specimen Description IN/OUT CATH URINE  Final   Special Requests NONE  Final   Culture (A)  Final    <10,000 COLONIES/mL INSIGNIFICANT GROWTH Performed at [facility], [place] [address] [place], [place] [address]    Report Status 10/04/2018 FINAL  Final         Radiology Studies: Dg Chest Port 1 View  Result Date: 10/02/2018 CLINICAL DATA:  Shortness of breath EXAM: PORTABLE CHEST 1 VIEW COMPARISON:  None. FINDINGS: The heart size and mediastinal contours are within normal limits. Both lungs are clear. The visualized skeletal structures are  unremarkable. IMPRESSION: No acute cardiopulmonary process. Electronically Signed   By: Jonna ClarkBindu  Avutu M.D.   On: 10/02/2018 22:42        Scheduled Meds: . enoxaparin (LOVENOX) injection  40 mg Subcutaneous Q24H  . insulin aspart  0-15 Units Subcutaneous TID WC  . insulin aspart  0-5 Units Subcutaneous QHS  . insulin glargine  20 Units Subcutaneous Daily  . living well with diabetes book   Does not apply Once   Continuous Infusions: . sodium chloride 50 mL/hr at 10/04/18 0819     LOS: 1 day    Time spent:8435min    Zannie CovePreetha Sidharth Leverette, MD [facility] 10/04/2018, 12:23 PM

## 2018-10-04 NOTE — Progress Notes (Signed)
Inpatient Diabetes Program Recommendations  AACE/ADA: New Consensus Statement on Inpatient Glycemic Control (2015)  Target Ranges:  Prepandial:   less than 140 mg/dL      Peak postprandial:   less than 180 mg/dL (1-2 hours)      Critically ill patients:  140 - 180 mg/dL   Lab Results  Component Value Date   GLUCAP 160 (H) 10/04/2018   HGBA1C 14.4 (H) 10/03/2018    Review of Glycemic Control Results for Robert Ballard, Robert Ballard (MRN [id number]) as of 10/04/2018 08:46  Ref. Range 10/04/2018 06:04 10/04/2018 06:44 10/04/2018 07:43  Glucose-Capillary Latest Ref Range: 70 - 99 mg/dL 170 (H) 172 (H) 160 (H)   Diabetes history: Type 2 DM Outpatient Diabetes medications: Novolin 70/30 20 units BID (not taking) Current orders for Inpatient glycemic control: IV insulin To transition to : Lantus 20 units QD, Novolog 0-15 units TID, Novolog 0-5 units QHS  Inpatient Diabetes Program Recommendations:   Spoke with patient regarding outpatient management. Patient was not taking insulin following previous hospitalization because "it made me feel funny." Denies checking blood sugars because of being busy at work. Reviewed patient's current A1c of 14.4%. Explained what a A1c is and what it measures. Also reviewed goal A1c with patient, importance of good glucose control @ home, and blood sugar goals. Reviewed patho of DM, DKA, need for insulin, role of pancreas, hyper vs hypo glycemia, interventions including glucose tabs while on the job, vascular changes and comorbidites.  Patient will need a meter. Will provide information on Relion products on discharge summary. Encouraged to check 2-3 times per day and to ensure patient is eating at time of injection.  Reviewed when to call MD. CM consult placed for patient to ensure follow up. Stressed the importance of patient continuing with medication and to follow up. Patient has no further questions at this time  Thanks, Bronson Curb, MSN, RNC-OB Diabetes  Coordinator [phone] (8a-5p)

## 2018-10-04 NOTE — Plan of Care (Signed)

## 2018-10-05 ENCOUNTER — Telehealth: Payer: Self-pay

## 2018-10-05 DIAGNOSIS — D72829 Elevated white blood cell count, unspecified: Secondary | ICD-10-CM

## 2018-10-05 LAB — BASIC METABOLIC PANEL
Anion gap: 13 (ref 5–15)
BUN: 7 mg/dL (ref 6–20)
CO2: 18 mmol/L — ABNORMAL LOW (ref 22–32)
Calcium: 8.3 mg/dL — ABNORMAL LOW (ref 8.9–10.3)
Chloride: 105 mmol/L (ref 98–111)
Creatinine, Ser: 0.63 mg/dL (ref 0.61–1.24)
GFR calc Af Amer: >60 mL/min (ref >60)
GFR calc non Af Amer: >60 mL/min (ref >60)
Glucose, Bld: 219 mg/dL — ABNORMAL HIGH (ref 70–99)
Potassium: 3.2 mmol/L — ABNORMAL LOW (ref 3.5–5.1)
Sodium: 136 mmol/L (ref 135–145)

## 2018-10-05 LAB — GLUCOSE, CAPILLARY
Glucose-Capillary: 227 mg/dL — ABNORMAL HIGH (ref 70–99)
Glucose-Capillary: 337 mg/dL — ABNORMAL HIGH (ref 70–99)

## 2018-10-05 MED ORDER — NOVOLIN 70/30 RELION (70-30) 100 UNIT/ML ~~LOC~~ SUSP: 25.0000 [IU] | Freq: Two times a day (BID) | SUBCUTANEOUS | 1 refills | Status: DC

## 2018-10-05 MED ORDER — "INSULIN SYRINGE 31G X 5/16"" 0.5 ML MISC": 0 refills | Status: DC

## 2018-10-05 MED ORDER — INSULIN ASPART PROT & ASPART (70-30 MIX) 100 UNIT/ML ~~LOC~~ SUSP
25.0000 [IU] | Freq: Two times a day (BID) | SUBCUTANEOUS | Status: DC
  Administered 2018-10-05: 25 [IU] via SUBCUTANEOUS
  Filled 2018-10-05: qty 10

## 2018-10-05 MED ORDER — LISINOPRIL 10 MG PO TABS: 10.0000 mg | ORAL_TABLET | Freq: Every day | ORAL | 1 refills | Status: DC

## 2018-10-05 MED ORDER — BLOOD GLUCOSE MONITOR KIT: PACK | 0 refills | Status: DC

## 2018-10-05 MED FILL — ULTICARE INS 0.3 ML 30GX1/2: 30G X 1/2" | 30 days supply | Qty: 60 | Fill #0

## 2018-10-05 MED FILL — TRUEplus LANCETS 28G MISC: 30 days supply | Qty: 100 | Fill #0

## 2018-10-05 MED FILL — NOVOLIN 70/30 100 UNITS/ML: (70-30) 100 | 30 days supply | Qty: 20 | Fill #0

## 2018-10-05 MED FILL — TRUE METRIX GLUCOSE TEST ST: 30 days supply | Qty: 100 | Fill #0

## 2018-10-05 MED FILL — TRUE METRIX BLOOD GLUCOSE M: W/DEVICE | 30 days supply | Qty: 1 | Fill #0

## 2018-10-05 MED FILL — LISINOPRIL 10 MG TABS: 10 | 30 days supply | Qty: 30 | Fill #0

## 2018-10-05 NOTE — TOC Transition Note (Signed)
Transition of Care [facility]) - CM/SW Discharge Note Marvetta Gibbons RN, BSN Transitions of Care Unit 4E- RN Case Manager [phone]   Patient Details  Name: Robert Ballard MRN: [id number] Date of Birth: 07/04/1985  Transition of Care [facility]) CM/SW Contact:  Dawayne Patricia, RN Phone Number: 10/05/2018, 2:34 PM   Clinical Narrative:    Pt stable for transition home today, pt will be assisted with medications using MATCH program and TOC will assist with filling meds and deliver to bedside prior to discharge. Call also made to Jan with TCC clinic- and f/u appointment made with the [facility] for 9/29 at 10:50 for primary care needs. Pt has transportation and will d/c home once meds delivered to room.    Final next level of care: Home/Self Care Barriers to Discharge: No Barriers Identified   Patient Goals and CMS Choice Patient states their goals for this hospitalization and ongoing recovery are:: home   Choice offered to / list presented to : NA  Discharge Placement  Home                     Discharge Plan and Services   Discharge Planning Services: CM Consult, Medication Assistance, [place] Program, Follow-up appt scheduled, [facility] Post Acute Care Choice: NA          DME Arranged: N/A DME Agency: NA       HH Arranged: NA HH Agency: NA        Social Determinants of Health (SDOH) Interventions     Readmission Risk Interventions Readmission Risk Prevention Plan 10/05/2018  Post Dischage Appt Complete  Medication Screening Complete  Transportation Screening Complete  Some recent data might be hidden

## 2018-10-05 NOTE — Telephone Encounter (Signed)
Call received from Marvetta Gibbons, RN CM requesting a hospital follow up appointment ofr patient.  Informed her that an appointment has been scheduled for 10/13/2018 @ 1050 @ [employer]

## 2018-10-08 LAB — CULTURE, BLOOD (ROUTINE X 2)
Culture: NO GROWTH
Culture: NO GROWTH
Special Requests: ADEQUATE
Special Requests: ADEQUATE

## 2018-10-12 ENCOUNTER — Telehealth: Payer: Self-pay

## 2018-10-12 NOTE — Telephone Encounter (Signed)
Called patient to do their pre-visit COVID screening.  Call went to voicemail. Unable to do prescreening.  

## 2018-10-13 ENCOUNTER — Ambulatory Visit (INDEPENDENT_AMBULATORY_CARE_PROVIDER_SITE_OTHER): Payer: Self-pay | Admitting: Internal Medicine

## 2018-10-13 ENCOUNTER — Other Ambulatory Visit: Payer: Self-pay

## 2018-10-13 ENCOUNTER — Encounter: Payer: Self-pay | Admitting: Internal Medicine

## 2018-10-13 VITALS — BP 121/81 | HR 82 | Temp 97.3°F | Resp 17 | Ht 64.0 in | Wt 252.2 lb

## 2018-10-13 DIAGNOSIS — Z6841 Body Mass Index (BMI) 40.0 and over, adult: Secondary | ICD-10-CM

## 2018-10-13 DIAGNOSIS — Z23 Encounter for immunization: Secondary | ICD-10-CM

## 2018-10-13 DIAGNOSIS — I10 Essential (primary) hypertension: Secondary | ICD-10-CM

## 2018-10-13 DIAGNOSIS — Z09 Encounter for follow-up examination after completed treatment for conditions other than malignant neoplasm: Secondary | ICD-10-CM

## 2018-10-13 DIAGNOSIS — E1165 Type 2 diabetes mellitus with hyperglycemia: Secondary | ICD-10-CM

## 2018-10-13 DIAGNOSIS — E669 Obesity, unspecified: Secondary | ICD-10-CM

## 2018-10-13 DIAGNOSIS — Z794 Long term (current) use of insulin: Secondary | ICD-10-CM

## 2018-10-13 MED ORDER — NOVOLIN 70/30 RELION (70-30) 100 UNIT/ML ~~LOC~~ SUSP: 30.0000 [IU] | Freq: Two times a day (BID) | SUBCUTANEOUS | 6 refills | Status: DC

## 2018-10-13 MED ORDER — LISINOPRIL 10 MG PO TABS: 10.0000 mg | ORAL_TABLET | Freq: Every day | ORAL | 1 refills | Status: DC

## 2018-10-13 NOTE — Progress Notes (Signed)
Here to get established. Recently discharged for DKA.  Has been taking insulin as prescribed. States that blood sugars have been running in the higher 200s-320s.  Denies nausea, vomiting, numbness/tingling in feet, blurry vision, polyuria, polydipsia.  Is not fasting. Has breakfast.

## 2018-10-13 NOTE — Patient Instructions (Signed)
Increase insulin to 30 units twice a day.    La diabetes mellitus y las normas bsicas de atencin mdica Diabetes Mellitus and Standards of Medical Care Tratar la diabetes (diabetes mellitus) puede ser complicado. Su tratamiento de la diabetes puede ser administrado por un equipo de profesionales de la salud, que incluye:  Un mdico especializado en diabetes (endocrinlogo).  Un enfermero especializado o auxiliar mdico.  Enfermeros.  Un especialista en dietas y nutricin (nutricionista registrado).  Un educador certificado para el cuidado de la diabetes.  Un especialista en actividad fsica.  Un farmacutico.  Eliot Ford.  Un especialista en pies (podlogo).  Un dentista.  Un mdico de cabecera.  Un profesional de salud mental. Los mdicos siguen pautas para ayudarlo a Therapist, music la mejor calidad de atencin. El programa siguiente es una gua general para su plan de control de la diabetes. Los mdicos tambin podrn darle instrucciones ms especficas. Exmenes fsicos Tras ser diagnosticado con diabetes mellitus, y cada ao luego de esto, su mdico le preguntar acerca de sus antecedentes mdicos y familiares. Tambin har un examen fsico. Este examen puede incluir:  Medicin de la estatura, peso e ndice de masa corporal [facility]).  Control de la presin arterial. Esto se realiza en cada visita mdica de rutina. La presin arterial deseada puede variar en funcin de sus enfermedades, edad y otros factores.  Examen de la glndula tiroidea.  Examen de la piel.  Examen para deteccin de dao en los nervios (neuropata perifrica). Esto puede incluir controlar el pulso de las piernas y los pies, y el nivel de sensibilidad en las manos y pies.  Un examen de pies completo para inspeccionar la estructura y la piel de los pies, lo que incluye controlar si hay cortes, hematomas, enrojecimiento, ampollas, llagas u otros problemas.  Examen de deteccin de problemas con los vasos  sanguneos (vasculares), lo que puede incluir controlar el pulso de las piernas y los pies y Therapist, sports. Anlisis de [place]. Segn el plan de tratamiento y [place] necesidades personales, es posible que se le realicen las siguientes pruebas:  Anlisis de HbA1c (hemoglobina A1c). Este anlisis proporciona informacin sobre el control de la glucemia (glucosa en la sangre) durante los ltimos 2 o 43meses. [place] para ajustar el plan de tratamiento, de ser necesario. Este anlisis se har: ? Al menos 2 veces al ao si cumple los objetivos del tratamiento. ? [employer], si no cumple los objetivos del tratamiento o si sus objetivos del [place].  Anlisis de lpidos, lo que incluye colesterol LDL y HDL y niveles de triglicridos. ? En relacin al LDL, el objetivo es tener menos de 100mg /dl (5,5 mmol/l). Si tiene alto riesgo de complicaciones, el objetivo es tener menos de 70 mg/dl (3.9 mmol/l). ? En relacin al HDL, el objetivo es tener 40 mg/dl (2.2 mmol/l) o ms para los hombres y 50 mg/dl (2.8 mmol/l) o ms para las mujeres. Un nivel de colesterol HDL de 60 mg/dl (3.39mmol/l) o superior da una cierta proteccin contra la enfermedad cardaca. ? En relacin a los triglicridos, el objetivo es tener menos de 150 mg/dl (8,3 mmol/l).  Pruebas funcionales hepticas.  Pruebas de la funcin renal.  Pruebas de la funcin tiroidea. Exmenes dentales y [employer]  Visite al [employer] veces por ao.  Si tiene diabetes tipo1, el mdico puede recomendarle que se haga un examen ocular 3 a 5aos despus del diagnstico y, luego, Ardelia Mems vez al ao despus del Tree surgeon. ? Para  los nios que tienen diabetes tipo1, el pediatra puede recomendar un examen ocular cuando el nio tiene 10aos o ms y ha tenido diabetes durante 3 a 5 aos. Despus del primer examen, el nio debe hacerse un examen ocular una vez al ao.  Si tiene diabetes tipo2, el mdico puede recomendarle que se  haga un examen ocular ni bien haya sido diagnosticado y, luego, Pollyann Savoy al ao despus del Risk manager. Vacunas   Se recomienda aplicar de forma anual la vacuna contra la gripe (influenza) a todas las personas de en adelante que tengan diabetes.  La vacuna contra la neumona (antineumoccica) est recomendada para todas las personas de 2aos en adelante que tengan diabetes. Si tiene 65aos o ms, puede recibir Transport planner como una serie de dos inyecciones [place].  Se recomienda administrar la vacuna contra la hepatitisB en adultos poco despus de que hayan recibido el diagnstico de diabetes.  Los adultos y los nios que tienen diabetes deben recibir todas las vacunas de acuerdo con las recomendaciones especficas segn la edad de los Centros para el Control y la Prevencin de Child psychotherapist for Disease Control and Prevention, CDC). Salud mental y emocional Se recomienda Education officer, environmental controles para Engineer, manufacturing sntomas de trastornos de Psychologist, sport and exercise, ansiedad y depresin en el momento del diagnstico, y en una etapa posterior segn sea necesario. Si los controles revelan la presencia de sntomas (resultado positivo de los controles), es posible que deba someterse a ms evaluaciones y Printmaker con un profesional de salud mental. Plan de tratamiento Su plan de tratamiento ser revisado en cada visita mdica. Usted y Geophysical data processor lo siguiente:  Cmo est recibiendo los medicamentos, incluso la [place].  Cualquier efectos secundarios que est experimentando.  Sus objetivos deseados con relacin a la glucemia.  La frecuencia de su control de glucemia.  Hbitos del estilo de vida, como nivel de [place] as como consumo de tabaco, alcohol y Facilities manager. Educacin para el autocontrol de la diabetes El mdico evaluar qu tan bien se encuentran los niveles de glucemia y si est recibiendo la cantidad correcta de [place]. El mdico puede derivarlo a:  Engineer, water en educacin para la diabetes certificado para Company secretary la diabetes durante toda la vida, comenzando desde el diagnstico.  Un nutricionista matriculado que puede crear o revisar un plan de nutricin personal.  Un especialista en ejercicios que puede analizar el nivel de actividad y un plan de ejercicios. Resumen  Tratar la diabetes (diabetes mellitus) puede ser complicado. Su tratamiento de la diabetes puede ser administrado por un equipo de profesionales de Radiographer, therapeutic.  Los mdicos siguen una gua para ayudarlo a Tourist information centre manager mejor calidad de atencin.  Las normas asistenciales incluyen realizarse, regularmente, exmenes fsicos, anlisis de sangre y controles de la presin arterial, aplicarse vacunas, someterse a estudios de control e informarse sobre cmo controlar su diabetes.  Sus mdicos tambin podrn darle instrucciones ms especficas basndose en su salud. Esta informacin no tiene Theme park manager el consejo del mdico. Asegrese de hacerle al mdico cualquier pregunta que tenga. Document Released: 03/27/2009 Document Revised: 10/28/2016 Document Reviewed: 06/02/2012 Elsevier Patient Education  2020 [employer].

## 2018-10-13 NOTE — Progress Notes (Addendum)
Patient ID: Robert Ballard, male    DOB: 03-14-1985  MRN: [id number]  CC: Establish Care and Hospitalization Follow-up   Subjective: Robert Ballard is a 33 y.o. male who presents for hosp f/u and new pt visit.  Pt seen at [facility] His concerns today include:  DM type 2, obesity  Pt hosp  Last wk for 3 days with DKA.  A1C was 14.4. Was hosp 04/2017 with new dx DM in DKA also.  Started on insulin but stopped taking because he was feeling good and though he did not need it.  -Since discgh he has been compliant with Novolin 70/30 25 units BID since dischg -checks BS BID before meals.  Range in 200-300s Met with nutritionist while in hosp and found it helpful.  He stopped drinking sodas and gatorade.  Walking for 1 hr a few days a week.  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 No blurred vision No numbness in hands/feet  HTN:  On Lisinopril.  Limits salt in foods.   No CP/SOB/LE/dizziness  Past medical, surgical, family and social history reviewed and updated. Patient Active Problem List   Diagnosis Date Noted  . Hypertension   . Leukocytosis   . Hyperkalemia   . Diabetes mellitus without complication ([place])   . DKA (diabetic ketoacidoses) ([place]) 05/10/2017     Current Outpatient Medications on File Prior to Visit  Medication Sig Dispense Refill  . TRUE METRIX BLOOD GLUCOSE TEST test strip     . ULTICARE INSULIN SYRINGE 30G X 1/2" 0.3 ML MISC      No current facility-administered medications on file prior to visit.     No Known Allergies  Social History   Socioeconomic History  . Marital status: Single    Spouse name: Not on file  . Number of children: 2  . Years of education: Not on file  . Highest education level: Not on file  Occupational History  . Not on file  Social Needs  . Financial resource strain: Not on file  . Food insecurity    Worry: Not on file    Inability: Not on file  . Transportation needs    Medical: Not on file    Non-medical:  Not on file  Tobacco Use  . Smoking status: Former Research scientist (life sciences)  . Smokeless tobacco: Never Used  Substance and Sexual Activity  . Alcohol use: Not Currently  . Drug use: No  . Sexual activity: Yes  Lifestyle  . Physical activity    Days per week: Not on file    Minutes per session: Not on file  . Stress: Not on file  Relationships  . Social Herbalist on phone: Not on file    Gets together: Not on file    Attends religious service: Not on file    Active member of club or organization: Not on file    Attends meetings of clubs or organizations: Not on file    Relationship status: Not on file  . Intimate partner violence    Fear of current or ex partner: Not on file    Emotionally abused: Not on file    Physically abused: Not on file    Forced sexual activity: Not on file  Other Topics Concern  . Not on file  Social History Narrative  . Not on file    Family History  Family history unknown: Yes    Past Surgical History:  Procedure Laterality Date  . HAND  SURGERY      ROS: Review of Systems Negative except as stated above  PHYSICAL EXAM: BP 121/81   Pulse 82   Temp (!) 97.3 F (36.3 C) (Temporal)   Resp 17   Ht 5' 4"  (1.626 m)   Wt 252 lb 3.2 oz (114.4 kg)   SpO2 95%   BMI 43.29 kg/m   Physical Exam   General appearance - alert, well appearing, middle-age obese Hispanic male and in no distress Mental status - normal mood, behavior, speech, dress, motor activity, and thought processes Mouth - mucous membranes moist, pharynx normal without lesions Neck - supple, no significant adenopathy Chest - clear to auscultation, no wheezes, rales or rhonchi, symmetric air entry Heart - normal rate, regular rhythm, normal S1, S2, no murmurs, rubs, clicks or gallops Extremities - peripheral pulses normal, no pedal edema, no clubbing or cyanosis Diabetic Foot Exam - Simple   Simple Foot Form Visual Inspection No deformities, no ulcerations, no other skin  breakdown bilaterally: Yes Sensation Testing Intact to touch and monofilament testing bilaterally: Yes Pulse Check Posterior Tibialis and Dorsalis pulse intact bilaterally: Yes Comments     CMP Latest Ref Rng & Units 10/05/2018 10/04/2018 10/04/2018  Glucose 70 - 99 mg/dL 219(H) 122(H) 149(H)  BUN 6 - 20 mg/dL 7 5(L) 6  Creatinine 0.61 - 1.24 mg/dL 0.63 0.56(L) 0.63  Sodium 135 - 145 mmol/L 136 135 133(L)  Potassium 3.5 - 5.1 mmol/L 3.2(L) 3.6 3.4(L)  Chloride 98 - 111 mmol/L 105 110 108  CO2 22 - 32 mmol/L 18(L) 17(L) 16(L)  Calcium 8.9 - 10.3 mg/dL 8.3(L) 8.4(L) 8.2(L)  Total Protein 6.5 - 8.1 g/dL - - -  Total Bilirubin 0.3 - 1.2 mg/dL - - -  Alkaline Phos 38 - 126 U/L - - -  AST 15 - 41 U/L - - -  ALT 0 - 44 U/L - - -   Lipid Panel  No results found for: CHOL, TRIG, HDL, CHOLHDL, VLDL, LDLCALC, LDLDIRECT  CBC    Component Value Date/Time   WBC 15.9 (H) 10/04/2018 0259   RBC 4.75 10/04/2018 0259   HGB 13.8 10/04/2018 0259   HCT 41.2 10/04/2018 0259   PLT 261 10/04/2018 0259   MCV 86.7 10/04/2018 0259   MCH 29.1 10/04/2018 0259   MCHC 33.5 10/04/2018 0259   RDW 14.6 10/04/2018 0259   LYMPHSABS 2.4 10/03/2018 1150   MONOABS 1.0 10/03/2018 1150   EOSABS 0.1 10/03/2018 1150   BASOSABS 0.1 10/03/2018 1150    ASSESSMENT AND PLAN: 1. Hospital discharge follow-up   2. Type 2 diabetes mellitus with hyperglycemia, with long-term current use of insulin (HCC) Reported blood sugar readings are not at goal.  Recommend increase Novolin 70/30 to 30 units twice a day.  Dietary counseling given.  Encouraged him to stay active. -Consider checking for antibodies on next visit to determine whether he is type I or type II diabetic.  I suspect type II. Advised to get diabetic eye exam done as soon as possible and once a year. - Microalbumin/Creatinine Ratio, Urine - lisinopril (ZESTRIL) 10 MG tablet; Take 1 tablet (10 mg total) by mouth daily.  Dispense: 90 tablet; Refill: 1 - insulin  NPH-regular Human (NOVOLIN 70/30 RELION) (70-30) 100 UNIT/ML injection; Inject 30 Units into the skin 2 (two) times daily with a meal.  Dispense: 10 mL; Refill: 6  3. Essential hypertension Close to goal.  Continue lisinopril and low-salt diet.  4. Need for vaccination against Streptococcus pneumoniae  Given  5. Need for Tdap vaccination Given     Patient was given the opportunity to ask questions.  Patient verbalized understanding of the plan and was able to repeat key elements of the plan.  [employer] interpreter used during this encounter. #[id number]   Orders Placed This Encounter  Procedures  . Microalbumin/Creatinine Ratio, Urine     Requested Prescriptions   Signed Prescriptions Disp Refills  . lisinopril (ZESTRIL) 10 MG tablet 90 tablet 1    Sig: Take 1 tablet (10 mg total) by mouth daily.  . insulin NPH-regular Human (NOVOLIN 70/30 RELION) (70-30) 100 UNIT/ML injection 10 mL 6    Sig: Inject 30 Units into the skin 2 (two) times daily with a meal.    Return in about 6 weeks (around 11/24/2018).  Karle Plumber, MD, FACP

## 2018-10-14 ENCOUNTER — Encounter: Payer: Self-pay | Admitting: Internal Medicine

## 2018-10-14 LAB — MICROALBUMIN / CREATININE URINE RATIO
Creatinine, Urine: 64 mg/dL
Microalb/Creat Ratio: 517 mg/g creat — ABNORMAL HIGH (ref 0–29)
Microalbumin, Urine: 330.7 ug/mL

## 2018-10-14 NOTE — Discharge Summary (Signed)
Physician Discharge Summary  Robert Ballard [facility] LNL:[id number] DOB: 1985/10/12 DOA: 10/02/2018  PCP: Patient, No Pcp Per  Admit date: 10/02/2018 Discharge date: 10/05/2018  Time spent: 35 minutes  Recommendations for Outpatient Follow-up:  1. [place] [facility] clinic Dr. Wynetta Emery 9/29   Discharge Diagnoses:  Principal Problem:   DKA (diabetic ketoacidoses) ([place]) Active Problems:   Hypertension   Leukocytosis   Hyperkalemia   Diabetes mellitus with macroalbuminuric diabetic nephropathy [facility])   Discharge Condition: Improved  Diet recommendation: Diabetic  Filed Weights   10/02/18 2227  Weight: 127 kg    History of present illness:  33 year old Hispanic male,who was admitted with DKA in April, was newly diagnosed with type 2 diabetes then, hemoglobin A1c was 13,discharged home on insulin 70/30,apparently she stopped taking his meds because he thought he did not need this anymore, unfortunately he could not be set up with a follow-up at [place] [facility] prior to discharge last time. -Back in the ED with DKA  Hospital Course:  Diabetic ketoacidosis -Anion gap is corrected transition to Lantus and sliding scale insulin -Hemoglobin A1c is 14.4 -Diabetes coordinator consult -Diet education -Case management consult needs follow-up set up at [facility] clinic  Hyperkalemia -Due to AKI, resolved  Morbid obesity, BMI 42 -RD consult, diet education  Hypertension -Blood pressure needs to be monitored post discharge  Discharge Exam: Vitals:   10/05/18 0803 10/05/18 1245  BP: (!) 149/92 (!) 141/78  Pulse: 98 (!) 103  Resp: 16 17  Temp: 98.4 F (36.9 C) 98.3 F (36.8 C)  SpO2: 99% 97%    General: AAO x3 Cardiovascular: S1-S2/regular rate rhythm Respiratory: Clear  Discharge Instructions   Discharge Instructions    Diet - low sodium heart healthy   Complete by: As directed    Diet Carb Modified   Complete by: As directed    Increase activity slowly   Complete by: As directed      Allergies as of 10/05/2018   No Known Allergies     Medication List    STOP taking these medications   blood glucose meter kit and supplies Kit   insulin NPH-regular Human (70-30) 100 UNIT/ML injection Commonly known as: NovoLIN 70/30 ReliOn   INSULIN SYRINGE .5CC/31GX5/16" 31G X 5/16" 0.5 ML Misc      No Known Allergies    The results of significant diagnostics from this hospitalization (including imaging, microbiology, ancillary and laboratory) are listed below for reference.    Significant Diagnostic Studies: Dg Chest Port 1 View  Result Date: 10/02/2018 CLINICAL DATA:  Shortness of breath EXAM: PORTABLE CHEST 1 VIEW COMPARISON:  None. FINDINGS: The heart size and mediastinal contours are within normal limits. Both lungs are clear. The visualized skeletal structures are unremarkable. IMPRESSION: No acute cardiopulmonary process. Electronically Signed   By: Prudencio Pair M.D.   On: 10/02/2018 22:42    Microbiology: No results found for this or any previous visit (from the past 240 hour(s)).   Labs: Basic Metabolic Panel: No results for input(s): NA, K, CL, CO2, GLUCOSE, BUN, CREATININE, CALCIUM, MG, PHOS in the last 168 hours. Liver Function Tests: No results for input(s): AST, ALT, ALKPHOS, BILITOT, PROT, ALBUMIN in the last 168 hours. No results for input(s): LIPASE, AMYLASE in the last 168 hours. No results for input(s): AMMONIA in the last 168 hours. CBC: No results for input(s): WBC, NEUTROABS, HGB, HCT, MCV, PLT in the last 168 hours. Cardiac Enzymes: No results for input(s): CKTOTAL, CKMB, CKMBINDEX, TROPONINI in  the last 168 hours. BNP: BNP (last 3 results) No results for input(s): BNP in the last 8760 hours.  ProBNP (last 3 results) No results for input(s): PROBNP in the last 8760 hours.  CBG: No results for input(s): GLUCAP in the last 168 hours.     Signed:  Domenic Polite MD.  [facility] 10/14/2018, 4:28 PM

## 2018-11-24 ENCOUNTER — Ambulatory Visit: Payer: Self-pay

## 2019-05-24 ENCOUNTER — Inpatient Hospital Stay (HOSPITAL_COMMUNITY)
Admission: EM | Admit: 2019-05-24 | Discharge: 2019-05-28 | DRG: 637 | Disposition: A | Discharge status: Home / Self Care | Payer: Self-pay | Attending: Internal Medicine | Admitting: Internal Medicine

## 2019-05-24 ENCOUNTER — Inpatient Hospital Stay (HOSPITAL_COMMUNITY): Payer: Self-pay

## 2019-05-24 ENCOUNTER — Other Ambulatory Visit: Payer: Self-pay

## 2019-05-24 ENCOUNTER — Encounter (HOSPITAL_COMMUNITY): Payer: Self-pay

## 2019-05-24 DIAGNOSIS — I10 Essential (primary) hypertension: Secondary | ICD-10-CM | POA: Diagnosis present

## 2019-05-24 DIAGNOSIS — E1021 Type 1 diabetes mellitus with diabetic nephropathy: Secondary | ICD-10-CM | POA: Diagnosis present

## 2019-05-24 DIAGNOSIS — E782 Mixed hyperlipidemia: Secondary | ICD-10-CM | POA: Diagnosis present

## 2019-05-24 DIAGNOSIS — R109 Unspecified abdominal pain: Secondary | ICD-10-CM

## 2019-05-24 DIAGNOSIS — E1110 Type 2 diabetes mellitus with ketoacidosis without coma: Secondary | ICD-10-CM | POA: Diagnosis present

## 2019-05-24 DIAGNOSIS — K859 Acute pancreatitis without necrosis or infection, unspecified: Secondary | ICD-10-CM | POA: Diagnosis present

## 2019-05-24 DIAGNOSIS — R21 Rash and other nonspecific skin eruption: Secondary | ICD-10-CM | POA: Diagnosis present

## 2019-05-24 DIAGNOSIS — E1010 Type 1 diabetes mellitus with ketoacidosis without coma: Principal | ICD-10-CM | POA: Diagnosis present

## 2019-05-24 DIAGNOSIS — R000 Tachycardia, unspecified: Secondary | ICD-10-CM | POA: Diagnosis present

## 2019-05-24 DIAGNOSIS — Z794 Long term (current) use of insulin: Secondary | ICD-10-CM

## 2019-05-24 DIAGNOSIS — E781 Pure hyperglyceridemia: Secondary | ICD-10-CM | POA: Diagnosis present

## 2019-05-24 DIAGNOSIS — Z20822 Contact with and (suspected) exposure to covid-19: Secondary | ICD-10-CM | POA: Diagnosis present

## 2019-05-24 DIAGNOSIS — N179 Acute kidney failure, unspecified: Secondary | ICD-10-CM | POA: Diagnosis present

## 2019-05-24 DIAGNOSIS — K760 Fatty (change of) liver, not elsewhere classified: Secondary | ICD-10-CM | POA: Diagnosis present

## 2019-05-24 DIAGNOSIS — E861 Hypovolemia: Secondary | ICD-10-CM | POA: Diagnosis present

## 2019-05-24 DIAGNOSIS — R748 Abnormal levels of other serum enzymes: Secondary | ICD-10-CM

## 2019-05-24 DIAGNOSIS — K5900 Constipation, unspecified: Secondary | ICD-10-CM | POA: Diagnosis present

## 2019-05-24 DIAGNOSIS — Z79899 Other long term (current) drug therapy: Secondary | ICD-10-CM

## 2019-05-24 LAB — BASIC METABOLIC PANEL
Anion gap: 26 — ABNORMAL HIGH (ref 5–15)
Anion gap: 16 — ABNORMAL HIGH (ref 5–15)
Anion gap: 29 — ABNORMAL HIGH (ref 5–15)
BUN: 14 mg/dL (ref 6–20)
BUN: 11 mg/dL (ref 6–20)
BUN: 14 mg/dL (ref 6–20)
CO2: 10 mmol/L — ABNORMAL LOW (ref 22–32)
CO2: 16 mmol/L — ABNORMAL LOW (ref 22–32)
CO2: 10 mmol/L — ABNORMAL LOW (ref 22–32)
Calcium: 8.1 mg/dL — ABNORMAL LOW (ref 8.9–10.3)
Calcium: 8.1 mg/dL — ABNORMAL LOW (ref 8.9–10.3)
Calcium: 8.3 mg/dL — ABNORMAL LOW (ref 8.9–10.3)
Chloride: 100 mmol/L (ref 98–111)
Chloride: 103 mmol/L (ref 98–111)
Chloride: 91 mmol/L — ABNORMAL LOW (ref 98–111)
Creatinine, Ser: 1.38 mg/dL — ABNORMAL HIGH (ref 0.61–1.24)
Creatinine, Ser: 0.99 mg/dL (ref 0.61–1.24)
Creatinine, Ser: 1.19 mg/dL (ref 0.61–1.24)
GFR calc Af Amer: >60 mL/min (ref >60)
GFR calc Af Amer: >60 mL/min (ref >60)
GFR calc Af Amer: >60 mL/min (ref >60)
GFR calc non Af Amer: >60 mL/min (ref >60)
GFR calc non Af Amer: >60 mL/min (ref >60)
GFR calc non Af Amer: >60 mL/min (ref >60)
Glucose, Bld: 356 mg/dL — ABNORMAL HIGH (ref 70–99)
Glucose, Bld: 244 mg/dL — ABNORMAL HIGH (ref 70–99)
Glucose, Bld: 373 mg/dL — ABNORMAL HIGH (ref 70–99)
Potassium: 4.7 mmol/L (ref 3.5–5.1)
Potassium: 4 mmol/L (ref 3.5–5.1)
Potassium: 5.7 mmol/L — ABNORMAL HIGH (ref 3.5–5.1)
Sodium: 136 mmol/L (ref 135–145)
Sodium: 135 mmol/L (ref 135–145)
Sodium: 130 mmol/L — ABNORMAL LOW (ref 135–145)

## 2019-05-24 LAB — COMPREHENSIVE METABOLIC PANEL
ALT: 22 U/L (ref 0–44)
AST: 24 U/L (ref 15–41)
Albumin: 2.9 g/dL — ABNORMAL LOW (ref 3.5–5.0)
Alkaline Phosphatase: 91 U/L (ref 38–126)
Anion gap: 24 — ABNORMAL HIGH (ref 5–15)
BUN: 13 mg/dL (ref 6–20)
CO2: 12 mmol/L — ABNORMAL LOW (ref 22–32)
Calcium: 7.8 mg/dL — ABNORMAL LOW (ref 8.9–10.3)
Chloride: 100 mmol/L (ref 98–111)
Creatinine, Ser: 1.56 mg/dL — ABNORMAL HIGH (ref 0.61–1.24)
GFR calc Af Amer: >60 mL/min (ref >60)
GFR calc non Af Amer: 58 mL/min — ABNORMAL LOW (ref >60)
Glucose, Bld: 389 mg/dL — ABNORMAL HIGH (ref 70–99)
Potassium: 4.6 mmol/L (ref 3.5–5.1)
Sodium: 136 mmol/L (ref 135–145)
Total Bilirubin: 2.1 mg/dL — ABNORMAL HIGH (ref 0.3–1.2)
Total Protein: 6.5 g/dL (ref 6.5–8.1)

## 2019-05-24 LAB — LIPID PANEL
Cholesterol: 611 mg/dL — ABNORMAL HIGH (ref 0–200)
Triglycerides: 2275 mg/dL — ABNORMAL HIGH (ref <150)
VLDL: UNDETERMINED mg/dL (ref 0–40)

## 2019-05-24 LAB — URINALYSIS, ROUTINE W REFLEX MICROSCOPIC
Bilirubin Urine: NEGATIVE
Glucose, UA: >=500 mg/dL — AB
Ketones, ur: 80 mg/dL — AB
Leukocytes,Ua: NEGATIVE
Nitrite: NEGATIVE
Protein, ur: 100 mg/dL — AB
Specific Gravity, Urine: 1.028 (ref 1.005–1.030)
pH: 5 (ref 5.0–8.0)

## 2019-05-24 LAB — CBC WITH DIFFERENTIAL/PLATELET
Abs Immature Granulocytes: 0.07 10*3/uL (ref 0.00–0.07)
Basophils Absolute: 0.1 10*3/uL (ref 0.0–0.1)
Basophils Relative: 1 %
Eosinophils Absolute: 0 10*3/uL (ref 0.0–0.5)
Eosinophils Relative: 0 %
HCT: 49.4 % (ref 39.0–52.0)
Hemoglobin: 17.1 g/dL — ABNORMAL HIGH (ref 13.0–17.0)
Immature Granulocytes: 0 %
Lymphocytes Relative: 9 %
Lymphs Abs: 1.5 10*3/uL (ref 0.7–4.0)
MCH: 29.9 pg (ref 26.0–34.0)
MCHC: 34.6 g/dL (ref 30.0–36.0)
MCV: 86.4 fL (ref 80.0–100.0)
Monocytes Absolute: 0.7 10*3/uL (ref 0.1–1.0)
Monocytes Relative: 4 %
Neutro Abs: 14.8 10*3/uL — ABNORMAL HIGH (ref 1.7–7.7)
Neutrophils Relative %: 86 %
Platelets: 395 10*3/uL (ref 150–400)
RBC: 5.72 MIL/uL (ref 4.22–5.81)
RDW: 13.8 % (ref 11.5–15.5)
WBC: 17.1 10*3/uL — ABNORMAL HIGH (ref 4.0–10.5)
nRBC: 0 % (ref 0.0–0.2)

## 2019-05-24 LAB — ETHANOL: Alcohol, Ethyl (B): <10 mg/dL (ref <10)

## 2019-05-24 LAB — CBG MONITORING, ED
Glucose-Capillary: 418 mg/dL — ABNORMAL HIGH (ref 70–99)
Glucose-Capillary: 310 mg/dL — ABNORMAL HIGH (ref 70–99)
Glucose-Capillary: 306 mg/dL — ABNORMAL HIGH (ref 70–99)
Glucose-Capillary: 250 mg/dL — ABNORMAL HIGH (ref 70–99)
Glucose-Capillary: 231 mg/dL — ABNORMAL HIGH (ref 70–99)
Glucose-Capillary: 212 mg/dL — ABNORMAL HIGH (ref 70–99)
Glucose-Capillary: 212 mg/dL — ABNORMAL HIGH (ref 70–99)
Glucose-Capillary: 231 mg/dL — ABNORMAL HIGH (ref 70–99)
Glucose-Capillary: 223 mg/dL — ABNORMAL HIGH (ref 70–99)

## 2019-05-24 LAB — BETA-HYDROXYBUTYRIC ACID: Beta-Hydroxybutyric Acid: 6.84 mmol/L — ABNORMAL HIGH (ref 0.05–0.27)

## 2019-05-24 LAB — SARS CORONAVIRUS 2 BY RT PCR (HOSPITAL ORDER, PERFORMED IN ~~LOC~~ HOSPITAL LAB): SARS Coronavirus 2: NEGATIVE

## 2019-05-24 LAB — HEMOGLOBIN A1C
Hgb A1c MFr Bld: 14 % — ABNORMAL HIGH (ref 4.8–5.6)
Mean Plasma Glucose: 355.1 mg/dL

## 2019-05-24 LAB — LACTIC ACID, PLASMA: Lactic Acid, Venous: 2 mmol/L (ref 0.5–1.9)

## 2019-05-24 LAB — LIPASE, BLOOD: Lipase: 712 U/L — ABNORMAL HIGH (ref 11–51)

## 2019-05-24 MED ORDER — SODIUM CHLORIDE 0.9 % IV BOLUS
1000.0000 mL | INTRAVENOUS | Status: AC
  Administered 2019-05-24: 1000 mL via INTRAVENOUS

## 2019-05-24 MED ORDER — DEXTROSE-NACL 5-0.45 % IV SOLN: INTRAVENOUS | Status: DC

## 2019-05-24 MED ORDER — ENOXAPARIN SODIUM 40 MG/0.4ML ~~LOC~~ SOLN
40.0000 mg | SUBCUTANEOUS | Status: DC
  Administered 2019-05-24 – 2019-05-26 (×3): 40 mg via SUBCUTANEOUS
  Filled 2019-05-24 (×3): qty 0.4

## 2019-05-24 MED ORDER — LACTATED RINGERS IV BOLUS
1000.0000 mL | Freq: Once | INTRAVENOUS | Status: AC
  Administered 2019-05-24: 1000 mL via INTRAVENOUS

## 2019-05-24 MED ORDER — POLYETHYLENE GLYCOL 3350 17 G PO PACK: 17.0000 g | PACK | Freq: Every day | ORAL | Status: DC | PRN

## 2019-05-24 MED ORDER — INSULIN REGULAR(HUMAN) IN NACL 100-0.9 UT/100ML-% IV SOLN
INTRAVENOUS | Status: DC
  Filled 2019-05-24: qty 100

## 2019-05-24 MED ORDER — ONDANSETRON HCL 4 MG/2ML IJ SOLN
4.0000 mg | Freq: Once | INTRAMUSCULAR | Status: AC
  Administered 2019-05-24: 4 mg via INTRAVENOUS
  Filled 2019-05-24: qty 2

## 2019-05-24 MED ORDER — SODIUM CHLORIDE 0.9% FLUSH
3.0000 mL | Freq: Two times a day (BID) | INTRAVENOUS | Status: DC
  Administered 2019-05-24 – 2019-05-27 (×5): 3 mL via INTRAVENOUS

## 2019-05-24 MED ORDER — SODIUM CHLORIDE 0.9 % IV SOLN: INTRAVENOUS | Status: DC

## 2019-05-24 MED ORDER — DEXTROSE 50 % IV SOLN: 0.0000 mL | INTRAVENOUS | Status: DC | PRN

## 2019-05-24 MED ORDER — INSULIN REGULAR(HUMAN) IN NACL 100-0.9 UT/100ML-% IV SOLN
INTRAVENOUS | Status: DC
  Administered 2019-05-24: 10.5 [IU]/h via INTRAVENOUS

## 2019-05-24 MED ORDER — SODIUM CHLORIDE 0.9 % IV BOLUS: 1000.0000 mL | INTRAVENOUS | Status: DC

## 2019-05-24 MED ORDER — HYDROMORPHONE HCL 1 MG/ML IJ SOLN
0.5000 mg | INTRAMUSCULAR | Status: DC | PRN
  Administered 2019-05-24 – 2019-05-25 (×2): 0.5 mg via INTRAVENOUS
  Filled 2019-05-24 (×2): qty 1

## 2019-05-24 MED ORDER — ONDANSETRON HCL 4 MG/2ML IJ SOLN
4.0000 mg | Freq: Three times a day (TID) | INTRAMUSCULAR | Status: DC | PRN
  Administered 2019-05-24: 4 mg via INTRAVENOUS
  Filled 2019-05-24: qty 2

## 2019-05-24 MED ORDER — POTASSIUM CHLORIDE 10 MEQ/100ML IV SOLN
10.0000 meq | INTRAVENOUS | Status: AC
  Administered 2019-05-24 (×2): 10 meq via INTRAVENOUS
  Filled 2019-05-24 (×2): qty 100

## 2019-05-24 NOTE — ED Notes (Signed)
Gave patient cup of ice patient is resting with call bell in reach

## 2019-05-24 NOTE — ED Notes (Signed)
Contacted lab to check status of lab results. RN informed by lab that specimens were stuck in tube system and needed to be resent. Provider notified, labs redrawn and walked to lab by ED secretary.

## 2019-05-24 NOTE — ED Notes (Signed)
Requested secretary to contact admitting for pain medication for patient.

## 2019-05-24 NOTE — ED Triage Notes (Signed)
Pt brought from home via EMS with c/o n/v and freq urination x2 days. Pt reports 5-6 episodes of vomiting in last 24 hours. Pt states his home glucometer readings have been in the 400s for the past 2 days since he started feeling sick. CBG with EMS was 369. 300 mL NS given by EMS en route. Pt currently A&O x4, tachycardic 118 HR. CBG 418 upon arrival to ED.

## 2019-05-24 NOTE — H&P (Addendum)
Date: 05/24/2019               Patient Name:  Robert Ballard MRN: [id number]  DOB: 11/08/1985 Age / Sex: 34 y.o., male   PCP: Robert Ballard         Medical Service: Internal Medicine Teaching Service         Attending Physician: Dr. Inez Catalina, MD    First Contact: Dr. Huel Cote Pager: [phone]  Second Contact: Dr. Gwyneth Revels Pager: [phone]       After Hours (After 5p/  First Contact Pager: [phone]  weekends / holidays): Second Contact Pager: [phone]   Chief Complaint: Nausea/Vomiting  History of Present Illness: Robert Ballard is a 34 y/o male with a PMHx of uncontrolled T2DM with previous DKA episodes x 2 complicated by diabetic nephropathy, who presents to the ED with c/o nausea,vomiting.History was obtained via the patient and through chart review.   3 days ago the patient noticed his fasting CBGs were consistently > 400. He would take his insulin but was unable to get his CBGs to come down. He then developed nausea and abdominal pain. He describes his emesis as dark brown. His abdominal pain is located in the LUQ and just seems to be getting progressively worse. He has not noticed anything that makes it better. In addition to the above symptoms he has noticed, polyuria, polydipsia, constipation, and a new rash on his arms and legs. He thinks that the rash is secondary to insulation from a recent roofing job. It started about 1 week ago. 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 It usually takes a couple weeks to resolve.    Denies fevers/chills, sick contact, chest pain, SHOB, diarrhea, dysuria, hematuria, bloody stools, myalgias, arthralgias. Denies a history of abdominal surgeries. Denies the use of EtOH or illicit substance use. He has been complaint with his insulin and has not missed any doses. He has been admitted in the past for DKA  Meds:  Current Meds  Medication Sig  . insulin NPH-regular Human (NOVOLIN 70/30 RELION)  (70-30) 100 UNIT/ML injection Inject 30 Units into the skin 2 (two) times daily with a meal.   Allergies: Allergies as of 05/24/2019  . (No Known Allergies)   Past Medical History:  Diagnosis Date  . Back pain   . Diabetes mellitus without complication (HCC)   . Hypertension    Family History:  Patient states he does not know any family history.   Social History:  Denies alcohol, tobacco and drug use.  Lives in [place] with his wife and 2 children.  Works as a Designer, fashion/clothing   Review of Systems: A complete ROS was negative except as Ballard HPI.  Physical Exam: Blood pressure 132/80, pulse (!) 112, temperature 98.5 F (36.9 C), temperature source Oral, resp. rate 20, height 5\' 8"  (1.727 m), weight 99.8 kg, SpO2 97 %.  Physical Exam Vitals and nursing note reviewed.  Constitutional:      Appearance: He is obese. He is ill-appearing.  Cardiovascular:     Rate and Rhythm: Regular rhythm. Tachycardia present.     Heart sounds: No murmur.  Pulmonary:     Effort: Pulmonary effort is normal. No respiratory distress.     Breath sounds: Normal breath sounds. No wheezing, rhonchi or rales.  Abdominal:     General: Bowel sounds are decreased.     Palpations: Abdomen is soft. There is no hepatomegaly.  Tenderness: There is abdominal tenderness in the right upper quadrant and epigastric area. There is guarding.  Skin:    General: Skin is warm and dry.     Comments: Innumerable small (1-3 mm) circular papules widespread, including the extensor surface of the left and right elbow and forearm, back . No central umbilicus or evidence of pus. No erythema.   Neurological:     General: No focal deficit present.     Mental Status: He is alert and oriented to person, place, and time. Mental status is at baseline.  Psychiatric:        Mood and Affect: Mood normal.        Behavior: Behavior normal.    EKG: personally reviewed my interpretation is: Sinus tachycardia with non-specific T wave  inversions present.  Assessment & Plan by Problem: Active Problems:   Diabetic ketoacidosis associated with type 2 diabetes mellitus [facility])  Robert Ballard is a 34 y/o male with a PMHx of uncontrolled T2DM with previous DKA episodes x 2 complicated by diabetic nephropathy, who presents to the ED with c/o nausea,vomiting and now admitted for evaluation and management of anion gap metabolic acidosis with hyperglycemia, likely  DKA.   # Diabetic Ketoacidosis # Type 2 Diabetes Mellitus with Diabetic Nephropathy  Trigger may be due to medication non-compliance (a1c has been ~14% for 2 years now, likely from poor disease process understanding) versus acute abdominal process given his significant abdominal pain and nausea/vomiting.   - IV insulin via [employer] - s/p 2 L (1 NS, 1 LR)  - NS @ 75 cc/hr till CBG of 250, then switch to D5-1/2NS - BMP q4h x 5 - s/p KCl 20 mEq  - Transition to SQ once AG closes x2 and tolerating diet - Monitor potassium; replenish PRN - Beta-hydroxybutyric acid  - Holding lisinopril due to AKI  # Acute Pancreatitis   Significant tenderness on examination. Lipase markedly elevated at > 700. Elevated T. Bili but rest of LFTs are WNL. Patient denies alcohol use, but will check ethanol levels. Concern for hypertriglyceridemia, which is supported by his rash that is consistent with eruptive xanthomas and liver ultrasound showing hepatic steatosis.   - IVF resuscitation  - Lipid panel stat - Continue IV insulin  ` # Acute Kidney Injury  Likely hypovolemic in light of DKA.   - IVF resuscitation  - Trend creatinine - Trend urine output   # Rash Most consistent with eruptive xanthomas considering his acute pancreatitis. Differential also includes molluscum contagiosum, but less likely. Will continue to monitor.     Dispo: Admit patient to Inpatient with expected length of stay greater than 2 midnights.  Signed: Dr. Chesky Persia Internal Medicine PGY-1    Pager: [phone] 05/24/2019, 3:54 PM

## 2019-05-24 NOTE — ED Provider Notes (Addendum)
[facility] EMERGENCY DEPARTMENT Provider Note   CSN: [id number] Arrival date & time: 05/24/19  1026     History Chief Complaint  Patient presents with  . Hyperglycemia  . Emesis    Robert Ballard is a 34 y.o. male.  The history is provided by the patient. No language interpreter was used.  Hyperglycemia Blood sugar level PTA:  400 Severity:  Severe Onset quality:  Sudden Duration:  2 days Timing:  Constant Progression:  Worsening Chronicity:  New Diabetes status:  Controlled with insulin Relieved by:  Nothing Ineffective treatments:  None tried Associated symptoms: vomiting   Associated symptoms: no abdominal pain   Risk factors: hx of DKA   Emesis Associated symptoms: no abdominal pain   Pt complains of glucose being high.  Pt has been vomiting.  Pt has a history of dka.      Past Medical History:  Diagnosis Date  . Back pain   . Diabetes mellitus without complication (HCC)   . Hypertension     Patient Active Problem List   Diagnosis Date Noted  . Hypertension   . Leukocytosis   . Hyperkalemia   . Diabetes mellitus with macroalbuminuric diabetic nephropathy (HCC)   . DKA (diabetic ketoacidoses) (HCC) 05/10/2017    Past Surgical History:  Procedure Laterality Date  . HAND SURGERY         Family History  Family history unknown: Yes    Social History   Tobacco Use  . Smoking status: Former Games developer  . Smokeless tobacco: Never Used  Substance Use Topics  . Alcohol use: Not Currently  . Drug use: No    Home Medications Prior to Admission medications   Medication Sig Start Date End Date Taking? Authorizing Provider  insulin NPH-regular Human (NOVOLIN 70/30 RELION) (70-30) 100 UNIT/ML injection Inject 30 Units into the skin 2 (two) times daily with a meal. 10/13/18  Yes Marcine Matar, MD  lisinopril (ZESTRIL) 10 MG tablet Take 1 tablet (10 mg total) by mouth daily. Patient not taking: Reported on 05/24/2019  10/13/18   Marcine Matar, MD  TRUE [facility] BLOOD GLUCOSE TEST test strip  10/05/18   [provider]  Stann Ore INSULIN SYRINGE 30G X 1/2" 0.3 ML MISC  10/05/18   [provider]    Allergies    Patient has no known allergies.  Review of Systems   Review of Systems  Gastrointestinal: Positive for vomiting. Negative for abdominal pain.  All other systems reviewed and are negative.   Physical Exam Updated Vital Signs BP 124/80   Pulse (!) 117   Temp 98.5 F (36.9 C) (Oral)   Resp 20   Ht 5\' 8"  (1.727 m)   Wt 99.8 kg   SpO2 95%   BMI 33.45 kg/m   Physical Exam Vitals and nursing note reviewed.  Constitutional:      Appearance: He is well-developed.  HENT:     Head: Normocephalic and atraumatic.  Eyes:     Conjunctiva/sclera: Conjunctivae normal.  Cardiovascular:     Rate and Rhythm: Normal rate and regular rhythm.     Heart sounds: No murmur.  Pulmonary:     Effort: Pulmonary effort is normal. No respiratory distress.     Breath sounds: Normal breath sounds.  Abdominal:     Palpations: Abdomen is soft.     Tenderness: There is no abdominal tenderness.  Musculoskeletal:        General: Normal range of motion.  Cervical back: Neck supple.  Skin:    General: Skin is warm and dry.  Neurological:     General: No focal deficit present.     Mental Status: He is alert.  Psychiatric:        Mood and Affect: Mood normal.     ED Results / Procedures / Treatments   Labs (all labs ordered are listed, but only abnormal results are displayed) Labs Reviewed  CBC WITH DIFFERENTIAL/PLATELET - Abnormal; Notable for the following components:      Result Value   WBC 17.1 (*)    Hemoglobin 17.1 (*)    Neutro Abs 14.8 (*)    All other components within normal limits  COMPREHENSIVE METABOLIC PANEL - Abnormal; Notable for the following components:   CO2 12 (*)    Glucose, Bld 389 (*)    Creatinine, Ser 1.56 (*)    Calcium 7.8 (*)    Albumin 2.9 (*)     Total Bilirubin 2.1 (*)    GFR calc non Af Amer 58 (*)    Anion gap 24 (*)    All other components within normal limits  CBG MONITORING, ED - Abnormal; Notable for the following components:   Glucose-Capillary 418 (*)    All other components within normal limits  OSMOLALITY  URINALYSIS, ROUTINE W REFLEX MICROSCOPIC  CBG MONITORING, ED    EKG None  Radiology No results found.  Procedures Procedures (including critical care time)  Medications Ordered in ED Medications  sodium chloride 0.9 % bolus 1,000 mL (0 mLs Intravenous Stopped 05/24/19 1131)  ondansetron (ZOFRAN) injection 4 mg (4 mg Intravenous Given 05/24/19 1056)    ED Course  I have reviewed the triage vital signs and the nursing notes.  Pertinent labs & imaging results that were available during my care of the patient were reviewed by me and considered in my medical decision making (see chart for details).    MDM Rules/Calculators/A&P                      MDM:  Pt given IV NS and zofran.  Labs returned atnd co2 is 12 anion gap is 24. Glucose 389  Final Clinical Impression(s) / ED Diagnoses Final diagnoses:  Diabetic ketoacidosis without coma associated with type 1 diabetes mellitus ([place])    Rx / DC Orders ED Discharge Orders    None    Unassigned medicine consulted for admission.  Dr. Marisa Hua will see for admission   Sidney Ace 05/24/19 1416    Sidney Ace 05/24/19 1436    Truddie Hidden, MD 05/24/19 [phone]

## 2019-05-24 NOTE — ED Notes (Signed)
Help get patient undress into a gown on the monitor patient is resting with call bell in reach 

## 2019-05-25 DIAGNOSIS — E1110 Type 2 diabetes mellitus with ketoacidosis without coma: Secondary | ICD-10-CM

## 2019-05-25 LAB — BASIC METABOLIC PANEL
Anion gap: 11 (ref 5–15)
Anion gap: 12 (ref 5–15)
BUN: 11 mg/dL (ref 6–20)
BUN: 11 mg/dL (ref 6–20)
CO2: 16 mmol/L — ABNORMAL LOW (ref 22–32)
CO2: 20 mmol/L — ABNORMAL LOW (ref 22–32)
Calcium: 7.7 mg/dL — ABNORMAL LOW (ref 8.9–10.3)
Calcium: 8 mg/dL — ABNORMAL LOW (ref 8.9–10.3)
Chloride: 103 mmol/L (ref 98–111)
Chloride: 104 mmol/L (ref 98–111)
Creatinine, Ser: 0.65 mg/dL (ref 0.61–1.24)
Creatinine, Ser: 0.78 mg/dL (ref 0.61–1.24)
GFR calc Af Amer: >60 mL/min (ref >60)
GFR calc Af Amer: >60 mL/min (ref >60)
GFR calc non Af Amer: >60 mL/min (ref >60)
GFR calc non Af Amer: >60 mL/min (ref >60)
Glucose, Bld: 101 mg/dL — ABNORMAL HIGH (ref 70–99)
Glucose, Bld: 229 mg/dL — ABNORMAL HIGH (ref 70–99)
Potassium: 3.8 mmol/L (ref 3.5–5.1)
Potassium: 4.1 mmol/L (ref 3.5–5.1)
Sodium: 131 mmol/L — ABNORMAL LOW (ref 135–145)
Sodium: 135 mmol/L (ref 135–145)

## 2019-05-25 LAB — CBG MONITORING, ED
Glucose-Capillary: 107 mg/dL — ABNORMAL HIGH (ref 70–99)
Glucose-Capillary: 108 mg/dL — ABNORMAL HIGH (ref 70–99)
Glucose-Capillary: 115 mg/dL — ABNORMAL HIGH (ref 70–99)
Glucose-Capillary: 116 mg/dL — ABNORMAL HIGH (ref 70–99)
Glucose-Capillary: 116 mg/dL — ABNORMAL HIGH (ref 70–99)
Glucose-Capillary: 117 mg/dL — ABNORMAL HIGH (ref 70–99)
Glucose-Capillary: 118 mg/dL — ABNORMAL HIGH (ref 70–99)
Glucose-Capillary: 119 mg/dL — ABNORMAL HIGH (ref 70–99)
Glucose-Capillary: 122 mg/dL — ABNORMAL HIGH (ref 70–99)
Glucose-Capillary: 125 mg/dL — ABNORMAL HIGH (ref 70–99)
Glucose-Capillary: 128 mg/dL — ABNORMAL HIGH (ref 70–99)
Glucose-Capillary: 128 mg/dL — ABNORMAL HIGH (ref 70–99)
Glucose-Capillary: 129 mg/dL — ABNORMAL HIGH (ref 70–99)
Glucose-Capillary: 132 mg/dL — ABNORMAL HIGH (ref 70–99)
Glucose-Capillary: 134 mg/dL — ABNORMAL HIGH (ref 70–99)
Glucose-Capillary: 142 mg/dL — ABNORMAL HIGH (ref 70–99)
Glucose-Capillary: 146 mg/dL — ABNORMAL HIGH (ref 70–99)
Glucose-Capillary: 147 mg/dL — ABNORMAL HIGH (ref 70–99)
Glucose-Capillary: 168 mg/dL — ABNORMAL HIGH (ref 70–99)
Glucose-Capillary: 195 mg/dL — ABNORMAL HIGH (ref 70–99)
Glucose-Capillary: 202 mg/dL — ABNORMAL HIGH (ref 70–99)
Glucose-Capillary: 213 mg/dL — ABNORMAL HIGH (ref 70–99)
Glucose-Capillary: 92 mg/dL (ref 70–99)

## 2019-05-25 LAB — CBC
HCT: 43.2 % (ref 39.0–52.0)
Hemoglobin: 14.6 g/dL (ref 13.0–17.0)
MCH: 29.4 pg (ref 26.0–34.0)
MCHC: 33.8 g/dL (ref 30.0–36.0)
MCV: 87.1 fL (ref 80.0–100.0)
Platelets: 295 10*3/uL (ref 150–400)
RBC: 4.96 MIL/uL (ref 4.22–5.81)
RDW: 14.2 % (ref 11.5–15.5)
WBC: 15.1 10*3/uL — ABNORMAL HIGH (ref 4.0–10.5)
nRBC: 0 % (ref 0.0–0.2)

## 2019-05-25 LAB — LACTIC ACID, PLASMA: Lactic Acid, Venous: 1.1 mmol/L (ref 0.5–1.9)

## 2019-05-25 LAB — COMPREHENSIVE METABOLIC PANEL
ALT: 20 U/L (ref 0–44)
AST: 30 U/L (ref 15–41)
Albumin: 2.2 g/dL — ABNORMAL LOW (ref 3.5–5.0)
Alkaline Phosphatase: 95 U/L (ref 38–126)
Anion gap: 10 (ref 5–15)
BUN: 10 mg/dL (ref 6–20)
CO2: 22 mmol/L (ref 22–32)
Calcium: 7.9 mg/dL — ABNORMAL LOW (ref 8.9–10.3)
Chloride: 100 mmol/L (ref 98–111)
Creatinine, Ser: 0.71 mg/dL (ref 0.61–1.24)
GFR calc Af Amer: >60 mL/min (ref >60)
GFR calc non Af Amer: >60 mL/min (ref >60)
Glucose, Bld: 223 mg/dL — ABNORMAL HIGH (ref 70–99)
Potassium: 4.5 mmol/L (ref 3.5–5.1)
Sodium: 132 mmol/L — ABNORMAL LOW (ref 135–145)
Total Bilirubin: 1 mg/dL (ref 0.3–1.2)
Total Protein: 5.9 g/dL — ABNORMAL LOW (ref 6.5–8.1)

## 2019-05-25 LAB — BETA-HYDROXYBUTYRIC ACID: Beta-Hydroxybutyric Acid: 0.27 mmol/L (ref 0.05–0.27)

## 2019-05-25 LAB — TRIGLYCERIDES
Triglycerides: 1197 mg/dL — ABNORMAL HIGH (ref <150)
Triglycerides: 778 mg/dL — ABNORMAL HIGH (ref <150)

## 2019-05-25 LAB — GLUCOSE, CAPILLARY
Glucose-Capillary: 283 mg/dL — ABNORMAL HIGH (ref 70–99)
Glucose-Capillary: 256 mg/dL — ABNORMAL HIGH (ref 70–99)
Glucose-Capillary: 261 mg/dL — ABNORMAL HIGH (ref 70–99)
Glucose-Capillary: 199 mg/dL — ABNORMAL HIGH (ref 70–99)
Glucose-Capillary: 182 mg/dL — ABNORMAL HIGH (ref 70–99)
Glucose-Capillary: 77 mg/dL (ref 70–99)

## 2019-05-25 MED ORDER — INSULIN (MYXREDLIN) INFUSION FOR HYPERTRIGLYCERIDEMIA
0.1000 [IU]/kg/h | INTRAVENOUS | Status: DC
  Administered 2019-05-25: 0.2 [IU]/kg/h via INTRAVENOUS
  Administered 2019-05-26 (×2): 0.15 [IU]/kg/h via INTRAVENOUS
  Administered 2019-05-27: 0.1 [IU]/kg/h via INTRAVENOUS
  Filled 2019-05-25 (×9): qty 100

## 2019-05-25 MED ORDER — ACETAMINOPHEN 325 MG PO TABS
650.0000 mg | ORAL_TABLET | Freq: Four times a day (QID) | ORAL | Status: DC | PRN
  Administered 2019-05-25 – 2019-05-28 (×4): 650 mg via ORAL
  Filled 2019-05-25 (×4): qty 2

## 2019-05-25 MED ORDER — DEXTROSE 10 % IV SOLN: INTRAVENOUS | Status: DC

## 2019-05-25 MED ORDER — INSULIN (MYXREDLIN) INFUSION FOR HYPERTRIGLYCERIDEMIA
0.2000 [IU]/kg/h | INTRAVENOUS | Status: DC
  Administered 2019-05-25: 0.2 [IU]/kg/h via INTRAVENOUS
  Filled 2019-05-25: qty 100

## 2019-05-25 NOTE — ED Notes (Signed)
CBG 119 

## 2019-05-25 NOTE — Progress Notes (Signed)
Subjective:   Patient reports that he was having a headache overnight. He reports his abdominal pain has improved. Denied any nausea or vomiting. He denies drinking caffeine in the morning.   We discussed the results of the labs that we got yesterday, discussed that his pancreas was inflamed and that his triglycerides were elevated. He reports that he does not have a family history of cholesterol issues.   Objective:  Vital signs in last 24 hours: Vitals:   05/25/19 0530 05/25/19 0600 05/25/19 0630 05/25/19 0700  BP: (!) 143/82 (!) 147/80 129/80 132/75  Pulse: (!) 104 (!) 106 (!) 102 (!) 101  Resp: 20 19 17 19   Temp:      TempSrc:      SpO2: 94% 94% 96% 94%  Weight:      Height:        Physical Exam Vitals and nursing note reviewed.  Constitutional:      General: He is not in acute distress.    Appearance: He is obese.  Cardiovascular:     Rate and Rhythm: Normal rate and regular rhythm.     Heart sounds: No murmur. No gallop.   Pulmonary:     Effort: Pulmonary effort is normal. No respiratory distress.     Breath sounds: No wheezing.  Abdominal:     General: Bowel sounds are normal.     Palpations: Abdomen is soft.     Comments: Continues to have some epigastric tenderness  Musculoskeletal:     Right lower leg: No edema.     Left lower leg: No edema.  Skin:    General: Skin is warm and dry.     Comments: Small, slightly yellow papules on bilateral extensor surfaces of upper extremity, back. No xanthoma overlying achilles tendons bilaterally.   Neurological:     General: No focal deficit present.     Mental Status: He is alert and oriented to person, place, and time. Mental status is at baseline.  Psychiatric:        Mood and Affect: Mood normal.        Behavior: Behavior normal.    Assessment/Plan:  Active Problems:   Diabetic ketoacidosis associated with type 2 diabetes mellitus [facility])  [name] is a 34 y/o male with a PMHx of uncontrolled  T2DM with previous DKA episodes x 2 complicated by diabetic nephropathy, who presents to the ED with c/o nausea,vomiting and now admitted for evaluation and management of anion gap metabolic acidosis with hyperglycemia, likely  DKA.   # Type 2 Diabetes Mellitus with Diabetic Nephropathy  # Diabetic Ketoacidosis - Resolved Trigger may be due to medication non-compliance (a1c has been ~14% for 2 years now, likely from poor disease process understanding) versus acute pancreatitis. DKA has now resolved with gap closed x2 and normalization of AG. Beta-hydroxybutyrate acid has normalized as well.   - IV insulin as noted below - Monitor potassium; replenish PRN - Holding lisinopril due to AKI  # Acute Pancreatitis # Hypertriglyceridemia # Eruptive xanthomas Significant tenderness on examination. Lipase markedly elevated at > 700. Lipid panel consistent with hypertriglyceridemia with triglycerides of 2200. This AM, triglycerides have become to improve down to 1197. Will continue IV insulin till triglycerides go below 500.   Given the severity of his levels as well as symptoms, including tachycardia, patient would have certainly been an appropriate candidate for apheresis on admission. Since we were treating DKA too though, treatment was initiated with IV insulin. At this time, he  is having a good response, but if triglycerides do not continue to improve steadily, we will need to consider apheresis.   Unfortunately, patient does know know much of his family history, making this difficult to assess if secondary to uncontrolled T2DM versus a primary cause such as familial.   - Trend triglycerides BID - IV insulin @ 20 units/hour (0.2 units/kg/hour) - D10, titrate to maintain CBGs > 100 - Hepatic panel pending  # Acute Kidney Injury - Resolved  Dispo: Anticipated discharge in approximately 3-4 day(s).   Dr. Hagan Persia Internal Medicine PGY-1  Pager: [phone] 05/25/2019, 7:13 AM

## 2019-05-25 NOTE — ED Notes (Signed)
Paged admitting to RN about insulin per RNs request

## 2019-05-25 NOTE — ED Notes (Signed)
Checked patient cbg it was 48 notified RN of blood sugar

## 2019-05-25 NOTE — ED Notes (Signed)
Spoke with MD with internal medicine, updated that CBG 115mg /dL, would like insulin drip to continue at 20unit/hr and D10% at 243ml/hr

## 2019-05-25 NOTE — ED Notes (Signed)
Paged admitting to RN per her request 

## 2019-05-25 NOTE — ED Notes (Signed)
Spoke with internal medicine MD via secure chat, ok for NPO to be D/Ced, diet ordered. Carb mod tray ordered at this time.

## 2019-05-25 NOTE — Progress Notes (Signed)
Inpatient Diabetes Program Recommendations  AACE/ADA: New Consensus Statement on Inpatient Glycemic Control (2015)  Target Ranges:  Prepandial:   less than 140 mg/dL      Peak postprandial:   less than 180 mg/dL (1-2 hours)      Critically ill patients:  140 - 180 mg/dL   Lab Results  Component Value Date   GLUCAP 129 (H) 05/25/2019   HGBA1C 14.0 (H) 05/24/2019    Review of Glycemic Control Results for Robert Ballard, Robert Ballard (MRN [id number]) as of 05/25/2019 09:39  Ref. Range 05/25/2019 06:05 05/25/2019 06:39 05/25/2019 07:26 05/25/2019 08:14  Glucose-Capillary Latest Ref Range: 70 - 99 mg/dL 335 (H) 825 (H) 189 (H) 129 (H)   Diabetes history: Type 2 DM Outpatient Diabetes medications: Novolin 70/30 30 units BID Current orders for Inpatient glycemic control: IV insulin   Inpatient Diabetes Program Recommendations:    Noted plan for admit. A1C 14%. Will plan to see patient once admitted.  DM coordinator last spoke with patient on 10/04/2018. At that visit patient states, "Patient was not taking insulin following previous hospitalization because "it made me feel funny." Denies checking blood sugars because of being busy at work." Will reassess this hospitalization. Based on A1C curious if patient taking insulin as stated.   Thanks, Lujean Rave, MSN, RNC-OB Diabetes Coordinator [phone] (8a-5p)

## 2019-05-25 NOTE — ED Notes (Signed)
Checked patient cbg it was 125 notified RN of blood sugar patient is resting with call bell in reach

## 2019-05-26 LAB — COMPREHENSIVE METABOLIC PANEL
ALT: 35 U/L (ref 0–44)
AST: 42 U/L — ABNORMAL HIGH (ref 15–41)
Albumin: 2.1 g/dL — ABNORMAL LOW (ref 3.5–5.0)
Alkaline Phosphatase: 145 U/L — ABNORMAL HIGH (ref 38–126)
Anion gap: 7 (ref 5–15)
BUN: 6 mg/dL (ref 6–20)
CO2: 24 mmol/L (ref 22–32)
Calcium: 8.1 mg/dL — ABNORMAL LOW (ref 8.9–10.3)
Chloride: 102 mmol/L (ref 98–111)
Creatinine, Ser: 0.7 mg/dL (ref 0.61–1.24)
GFR calc Af Amer: >60 mL/min (ref >60)
GFR calc non Af Amer: >60 mL/min (ref >60)
Glucose, Bld: 277 mg/dL — ABNORMAL HIGH (ref 70–99)
Potassium: 3.2 mmol/L — ABNORMAL LOW (ref 3.5–5.1)
Sodium: 133 mmol/L — ABNORMAL LOW (ref 135–145)
Total Bilirubin: 0.6 mg/dL (ref 0.3–1.2)
Total Protein: 5.6 g/dL — ABNORMAL LOW (ref 6.5–8.1)

## 2019-05-26 LAB — CBC WITH DIFFERENTIAL/PLATELET
Abs Immature Granulocytes: 0.06 10*3/uL (ref 0.00–0.07)
Basophils Absolute: 0.1 10*3/uL (ref 0.0–0.1)
Basophils Relative: 1 %
Eosinophils Absolute: 0.3 10*3/uL (ref 0.0–0.5)
Eosinophils Relative: 2 %
HCT: 39.3 % (ref 39.0–52.0)
Hemoglobin: 13 g/dL (ref 13.0–17.0)
Immature Granulocytes: 1 %
Lymphocytes Relative: 10 %
Lymphs Abs: 1.2 10*3/uL (ref 0.7–4.0)
MCH: 28.5 pg (ref 26.0–34.0)
MCHC: 33.1 g/dL (ref 30.0–36.0)
MCV: 86.2 fL (ref 80.0–100.0)
Monocytes Absolute: 0.8 10*3/uL (ref 0.1–1.0)
Monocytes Relative: 7 %
Neutro Abs: 8.8 10*3/uL — ABNORMAL HIGH (ref 1.7–7.7)
Neutrophils Relative %: 79 %
Platelets: 252 10*3/uL (ref 150–400)
RBC: 4.56 MIL/uL (ref 4.22–5.81)
RDW: 14.1 % (ref 11.5–15.5)
WBC: 11.1 10*3/uL — ABNORMAL HIGH (ref 4.0–10.5)
nRBC: 0 % (ref 0.0–0.2)

## 2019-05-26 LAB — GLUCOSE, CAPILLARY
Glucose-Capillary: 147 mg/dL — ABNORMAL HIGH (ref 70–99)
Glucose-Capillary: 151 mg/dL — ABNORMAL HIGH (ref 70–99)
Glucose-Capillary: 159 mg/dL — ABNORMAL HIGH (ref 70–99)
Glucose-Capillary: 159 mg/dL — ABNORMAL HIGH (ref 70–99)
Glucose-Capillary: 171 mg/dL — ABNORMAL HIGH (ref 70–99)
Glucose-Capillary: 171 mg/dL — ABNORMAL HIGH (ref 70–99)
Glucose-Capillary: 179 mg/dL — ABNORMAL HIGH (ref 70–99)
Glucose-Capillary: 180 mg/dL — ABNORMAL HIGH (ref 70–99)
Glucose-Capillary: 186 mg/dL — ABNORMAL HIGH (ref 70–99)
Glucose-Capillary: 188 mg/dL — ABNORMAL HIGH (ref 70–99)
Glucose-Capillary: 211 mg/dL — ABNORMAL HIGH (ref 70–99)
Glucose-Capillary: 218 mg/dL — ABNORMAL HIGH (ref 70–99)
Glucose-Capillary: 221 mg/dL — ABNORMAL HIGH (ref 70–99)
Glucose-Capillary: 242 mg/dL — ABNORMAL HIGH (ref 70–99)
Glucose-Capillary: 246 mg/dL — ABNORMAL HIGH (ref 70–99)

## 2019-05-26 LAB — TRIGLYCERIDES
Triglycerides: 608 mg/dL — ABNORMAL HIGH (ref <150)
Triglycerides: 577 mg/dL — ABNORMAL HIGH (ref <150)

## 2019-05-26 MED ORDER — POTASSIUM CHLORIDE CRYS ER 20 MEQ PO TBCR
40.0000 meq | EXTENDED_RELEASE_TABLET | Freq: Once | ORAL | Status: AC
  Administered 2019-05-26: 40 meq via ORAL
  Filled 2019-05-26: qty 2

## 2019-05-26 MED ORDER — DEXTROSE-NACL 5-0.45 % IV SOLN: INTRAVENOUS | Status: DC

## 2019-05-26 MED ORDER — LIVING WELL WITH DIABETES BOOK - IN SPANISH
Freq: Once | Status: AC
  Filled 2019-05-26: qty 1

## 2019-05-26 MED ORDER — LISINOPRIL 10 MG PO TABS
10.0000 mg | ORAL_TABLET | Freq: Every day | ORAL | Status: DC
  Administered 2019-05-26 – 2019-05-28 (×3): 10 mg via ORAL
  Filled 2019-05-26 (×3): qty 1

## 2019-05-26 NOTE — Progress Notes (Addendum)
Inpatient Diabetes Program Recommendations  AACE/ADA: New Consensus Statement on Inpatient Glycemic Control (2015)  Target Ranges:  Prepandial:   less than 140 mg/dL      Peak postprandial:   less than 180 mg/dL (1-2 hours)      Critically ill patients:  140 - 180 mg/dL  Results for Robert Ballard, Robert Ballard (MRN [id number]) as of 05/26/2019 09:19  Ref. Range 05/26/2019 00:58 05/26/2019 01:48 05/26/2019 02:20 05/26/2019 03:03 05/26/2019 04:21 05/26/2019 05:04 05/26/2019 06:15 05/26/2019 07:20  Glucose-Capillary Latest Ref Range: 70 - 99 mg/dL 188 (H) 211 (H) 221 (H) 246 (H) 186 (H) 151 (H) 159 (H) 171 (H)  Results for Robert Ballard, Robert Ballard (MRN [id number]) as of 05/26/2019 09:19  Ref. Range 05/24/2019 11:30  Glucose Latest Ref Range: 70 - 99 mg/dL 389 (H)   Results for Robert Ballard, Robert Ballard (MRN [id number]) as of 05/26/2019 09:19  Ref. Range 10/03/2018 03:30 05/24/2019 15:30  Hemoglobin A1C Latest Ref Range: 4.8 - 5.6 % 14.4 (H) 14.0 (H)   Review of Glycemic Control  Diabetes history: DM2 Outpatient Diabetes medications: 70/30 20-25 units BID Current orders for Inpatient glycemic control: IV insulin  Inpatient Diabetes Program Recommendations:   HbgA1C:  A1C 14% on 05/24/19 indicating an average glucose of 355 mg/dl over the past 2-3 months.  NOTE: Diabetes coordinator last spoke with patient on 10/04/2018 during last hospitalization. At that visit patient stated, "Patient was not taking insulin following previous hospitalization because "it made me feel funny." Denies checking blood sugars because of being busy at work." Ordered Living Well with DM book (Spanish).  Will plan to talk with patient today.   Addendum 05/26/19@14 :50-Used Stratus device for interpreter Allena Katz [phone]. Spoke with patient about diabetes and home regimen for diabetes control. Patient reports being followed by [facility] for diabetes management.  Patient reports that he has been to the clinic 2 times  since last hospital discharge in September 2020.  However, only office visit note in chart with Internal Medicine Clinic was on 10/13/18.  When asked about insulin patient was taking he stated he was taking the insulin he was discharged from the hospital on (but does not know the name of it). Patient reports that he got insulin filled at [facility] when he was discharged in September 2020 and received 4 vials of insulin which he has been taking and reports that he still has some insulin at home.  Patient states insulin was $100 when he got it filled and notes that it is affordable for him and he has everything at home for glucose monitoring and insulin injections. Per discharge summary on 10/05/18 patient ws prescribed Novolin 70/30 insulin. Patient states he is taking 20-25 units of the insulin twice a day. Patient reports that his glucose is usually 150- 200's mg/dl. When asked about what glucose targets should be patient stated around 200 mg/dl.  Patient reports taking DM medications as prescribed and taking it consistently. Patient injects insulin in left low quadrant of abdomen (uses the same location for all insulin injections).  Discussed insulin storage and explained that Novolin 70/30 vial should only be used for 30 days and then discarded and a new vial started. Also discussed different locations that can be used for insulin injections and site rotation. Asked patient to stay away from lower left quadrant of abdomen for a while and use other sites for insulin injections.  Patient reports that he does not know what an A1C is.   Discussed A1C results (14% on 05/24/19) and  explained that current A1C indicates an average glucose of 355 mg/dl over the past 2-3 months.  Explained what an A1C is.  Discussed glucose and A1C goals. Discussed importance of checking CBGs and maintaining good CBG control to prevent long-term and short-term complications. Explained how hyperglycemia leads to damage within blood vessels which  lead to the common complications seen with uncontrolled diabetes. Stressed to the patient the importance of improving glycemic control to prevent further complications from uncontrolled diabetes. Discussed impact of nutrition, exercise, stress, sickness, and medications on diabetes control.  Discussed hypertriglyceridemia and pancreatitis and how they impact glycemic control.  Discussed carbohydrates, carbohydrate goals per day and meal, along with portion sizes. Patient has Living Well with Diabetes (Spanish) at bedside and pointed out several things in book while talking with patient. Asked patient to read entire book to gain more knowledge about DM.  Asked patient to keep a written log of glucose readings and be sure to take the log with him to doctor visits.  Explained how the doctor can use the log book to continue to make adjustments with DM medications if needed.  Patient verbalized understanding of information discussed and reports no further questions at this time related to diabetes.  Thanks, Orlando Penner, RN, MSN, CDE Diabetes Coordinator Inpatient Diabetes Program [phone] (Team Pager from 8am to 5pm)

## 2019-05-26 NOTE — Progress Notes (Signed)
   Subjective:   Patient states his pain is okay this AM; still having a little LLQ discomfort. Still having headache that is ammenable to pain meds. He endorses tolerating his diet and having an appetite.   Objective:  Vital signs in last 24 hours: Vitals:   05/25/19 1936 05/25/19 2301 05/26/19 0302 05/26/19 0723  BP: 120/75   (!) 141/79  Pulse:      Resp: 20   20  Temp: 98.6 F (37 C) 98.4 F (36.9 C) 98.5 F (36.9 C) 98 F (36.7 C)  TempSrc: Oral Oral Oral Oral  SpO2:    97%  Weight:      Height:        Physical Exam Constitutional:      General: He is not in acute distress.    Appearance: He is obese.  HENT:     Head: Normocephalic and atraumatic.  Cardiovascular:     Rate and Rhythm: Normal rate and regular rhythm.     Heart sounds: No murmur.  Pulmonary:     Effort: Pulmonary effort is normal. No respiratory distress.  Abdominal:     General: Bowel sounds are normal. There is no distension.     Palpations: Abdomen is soft.     Tenderness: There is abdominal tenderness in the epigastric area, left upper quadrant and left lower quadrant.  Skin:    General: Skin is warm and dry.  Neurological:     General: No focal deficit present.     Mental Status: He is alert and oriented to person, place, and time. Mental status is at baseline.  Psychiatric:        Mood and Affect: Mood normal.        Behavior: Behavior normal.    Assessment/Plan:  Active Problems:   Diabetic ketoacidosis associated with type 2 diabetes mellitus [name])  [name] is a 34 y/o male with a PMHx of uncontrolled T2DM with previous DKA episodes x 2 complicated by diabetic nephropathy, who presents to the ED with c/o nausea,vomiting and now admitted for evaluation and management of anion gap metabolic acidosis with hyperglycemia, likely DKA.   # Type 2 Diabetes Mellitus with Diabetic Nephropathy  #Diabetic Ketoacidosis - Resolved Trigger may be due to medication  non-compliance (a1c has been ~14% for 2 years now, likely from poor disease process understanding) versus acute pancreatitis.  - IV insulin as noted below - Monitor potassium; replenish PRN - Restart Lisinopril  # Acute Pancreatitis # Hypertriglyceridemia # Eruptive xanthomas Lipase markedly elevated at > 700.Lipid panel consistent with hypertriglyceridemia with triglycerides of 2200. Unfortunately, patient does know know much of his family history, making this difficult to assess if secondary to uncontrolled T2DM versus a primary cause such as familial.   LFTs have remained WNL. Triglycerides have trended down steadily and are now in the 600s. Will transition to subcutaneous insulin at patient's home dose once triglyceride levels reach 500 or below. For now, continue IV insulin.   - Trend triglycerides BID - IV insulin @ 15 units/hour (0.15 units/kg/hour) - D5-1/2NS, titrate to maintain CBGs > 120  # Acute Kidney Injury - Resolved  Dispo: Anticipated discharge in approximately 1-2 day(s).   Dr. Verdene Lennert Internal Medicine PGY-1  Pager: [phone] 05/26/2019, 8:14 AM

## 2019-05-27 ENCOUNTER — Inpatient Hospital Stay (HOSPITAL_COMMUNITY): Payer: Self-pay

## 2019-05-27 LAB — COMPREHENSIVE METABOLIC PANEL
ALT: 59 U/L — ABNORMAL HIGH (ref 0–44)
AST: 43 U/L — ABNORMAL HIGH (ref 15–41)
Albumin: 2.3 g/dL — ABNORMAL LOW (ref 3.5–5.0)
Alkaline Phosphatase: 172 U/L — ABNORMAL HIGH (ref 38–126)
Anion gap: 12 (ref 5–15)
BUN: 5 mg/dL — ABNORMAL LOW (ref 6–20)
CO2: 22 mmol/L (ref 22–32)
Calcium: 8.4 mg/dL — ABNORMAL LOW (ref 8.9–10.3)
Chloride: 105 mmol/L (ref 98–111)
Creatinine, Ser: 0.54 mg/dL — ABNORMAL LOW (ref 0.61–1.24)
GFR calc Af Amer: >60 mL/min (ref >60)
GFR calc non Af Amer: >60 mL/min (ref >60)
Glucose, Bld: 110 mg/dL — ABNORMAL HIGH (ref 70–99)
Potassium: 3.2 mmol/L — ABNORMAL LOW (ref 3.5–5.1)
Sodium: 139 mmol/L (ref 135–145)
Total Bilirubin: 0.6 mg/dL (ref 0.3–1.2)
Total Protein: 5.8 g/dL — ABNORMAL LOW (ref 6.5–8.1)

## 2019-05-27 LAB — CBC WITH DIFFERENTIAL/PLATELET
Abs Immature Granulocytes: 0.05 10*3/uL (ref 0.00–0.07)
Basophils Absolute: 0.1 10*3/uL (ref 0.0–0.1)
Basophils Relative: 1 %
Eosinophils Absolute: 0.2 10*3/uL (ref 0.0–0.5)
Eosinophils Relative: 2 %
HCT: 41.5 % (ref 39.0–52.0)
Hemoglobin: 13.8 g/dL (ref 13.0–17.0)
Immature Granulocytes: 0 %
Lymphocytes Relative: 18 %
Lymphs Abs: 2 10*3/uL (ref 0.7–4.0)
MCH: 29 pg (ref 26.0–34.0)
MCHC: 33.3 g/dL (ref 30.0–36.0)
MCV: 87.2 fL (ref 80.0–100.0)
Monocytes Absolute: 0.8 10*3/uL (ref 0.1–1.0)
Monocytes Relative: 7 %
Neutro Abs: 8.3 10*3/uL — ABNORMAL HIGH (ref 1.7–7.7)
Neutrophils Relative %: 72 %
Platelets: 314 10*3/uL (ref 150–400)
RBC: 4.76 MIL/uL (ref 4.22–5.81)
RDW: 14 % (ref 11.5–15.5)
WBC: 11.5 10*3/uL — ABNORMAL HIGH (ref 4.0–10.5)
nRBC: 0 % (ref 0.0–0.2)

## 2019-05-27 LAB — GLUCOSE, CAPILLARY
Glucose-Capillary: 102 mg/dL — ABNORMAL HIGH (ref 70–99)
Glucose-Capillary: 131 mg/dL — ABNORMAL HIGH (ref 70–99)
Glucose-Capillary: 165 mg/dL — ABNORMAL HIGH (ref 70–99)
Glucose-Capillary: 174 mg/dL — ABNORMAL HIGH (ref 70–99)
Glucose-Capillary: 205 mg/dL — ABNORMAL HIGH (ref 70–99)
Glucose-Capillary: 228 mg/dL — ABNORMAL HIGH (ref 70–99)
Glucose-Capillary: 91 mg/dL (ref 70–99)
Glucose-Capillary: 96 mg/dL (ref 70–99)
Glucose-Capillary: 96 mg/dL (ref 70–99)
Glucose-Capillary: 98 mg/dL (ref 70–99)

## 2019-05-27 LAB — HEPATITIS C ANTIBODY: HCV Ab: NONREACTIVE

## 2019-05-27 LAB — HEPATITIS B SURFACE ANTIGEN: Hepatitis B Surface Ag: NONREACTIVE

## 2019-05-27 LAB — TRIGLYCERIDES
Triglycerides: 435 mg/dL — ABNORMAL HIGH (ref <150)
Triglycerides: 441 mg/dL — ABNORMAL HIGH (ref <150)

## 2019-05-27 LAB — HEPATITIS B CORE ANTIBODY, TOTAL: Hep B Core Total Ab: NONREACTIVE

## 2019-05-27 LAB — HEPATITIS A ANTIBODY, IGM: Hep A IgM: NONREACTIVE

## 2019-05-27 MED ORDER — ROSUVASTATIN CALCIUM 5 MG PO TABS
10.0000 mg | ORAL_TABLET | Freq: Every day | ORAL | Status: DC
  Administered 2019-05-27 – 2019-05-28 (×2): 10 mg via ORAL
  Filled 2019-05-27 (×2): qty 2

## 2019-05-27 MED ORDER — INSULIN ASPART PROT & ASPART (70-30 MIX) 100 UNIT/ML ~~LOC~~ SUSP
20.0000 [IU] | Freq: Two times a day (BID) | SUBCUTANEOUS | Status: DC
  Administered 2019-05-27 – 2019-05-28 (×2): 20 [IU] via SUBCUTANEOUS
  Filled 2019-05-27: qty 10

## 2019-05-27 MED ORDER — INSULIN NPH (HUMAN) (ISOPHANE) 100 UNIT/ML ~~LOC~~ SUSP
20.0000 [IU] | Freq: Two times a day (BID) | SUBCUTANEOUS | Status: DC
  Filled 2019-05-27: qty 10

## 2019-05-27 MED ORDER — POTASSIUM CHLORIDE CRYS ER 20 MEQ PO TBCR
40.0000 meq | EXTENDED_RELEASE_TABLET | Freq: Once | ORAL | Status: AC
  Administered 2019-05-27: 40 meq via ORAL
  Filled 2019-05-27: qty 2

## 2019-05-27 NOTE — Progress Notes (Signed)
   Subjective:   Mr. Vanblarcom reports he had some abdominal pain overnight but it was mild and self-resolved. He denies any nausea, vomiting.   We discussed plan for transition to [place] insulin and obtaining US due to elevated LFTs. Patient expressed understanding.   Objective:  Vital signs in last 24 hours: Vitals:   05/26/19 1618 05/26/19 2020 05/27/19 0007 05/27/19 0307  BP: 128/72 135/80 132/64 120/70  Pulse:   97 85  Resp: 20 (!) 23  19  Temp: 98 F (36.7 C) 99 F (37.2 C) 98.7 F (37.1 C) 98.6 F (37 C)  TempSrc: Oral Oral Oral Oral  SpO2: 98% 97%  98%  Weight:      Height:       Physical Exam Constitutional:      General: He is not in acute distress.    Appearance: He is obese.  Pulmonary:     Effort: Pulmonary effort is normal. No respiratory distress.  Abdominal:     General: There is no distension.     Palpations: Abdomen is soft.     Tenderness: There is no abdominal tenderness.  Skin:    General: Skin is warm and dry.  Neurological:     General: No focal deficit present.     Mental Status: He is alert and oriented to person, place, and time. Mental status is at baseline.  Psychiatric:        Mood and Affect: Mood normal.        Behavior: Behavior normal.    Assessment/Plan:  Active Problems:   Diabetic ketoacidosis associated with type 2 diabetes mellitus [facility])  Mr. Danel Requena is a 34 y/o male with a PMHx of uncontrolled T2DM with previous DKA episodes x 2 complicated by diabetic nephropathy, who presents to the ED with c/o nausea,vomiting and now admitted for evaluation and management of anion gap metabolic acidosis with hyperglycemia, likely DKA.   # Acute Pancreatitis # Hypertriglyceridemia # Eruptive xanthomas Lipase elevated at > 700 on admission.Lipid panel consistent with hypertriglyceridemia with triglycerides of 2200. Unfortunately, patient does know know much of his family history, making this difficult to assess if secondary  to uncontrolled T2DM versus a primary cause such as familial.Most likely a combination of both.   - Trend triglyceridesBID - Transition to [place] Novolin 70/30, 20 units BID - Discontinue insulin gtt and IVF fluids - Will need cholesterol medication on board before discharge: statin + fibrate   # Type 2 Diabetes Mellitus with Diabetic Nephropathy  #Diabetic Ketoacidosis- Resolved Trigger may be due to medication non-compliance (a1c has been ~14% for 2 years now, likely from poor disease process understanding) versus acutepancreatitis.  - Insulin as noted above - Monitor potassium; replenish PRN - Lisinopril - Will add on Metformin on discharge, looks like he has never been on it  # Acutely elevated Alkaline Phosphatase # Elevated LFTs Cholecystic pattern but asymptomatic. Will obtain imaging  - RUQ ultrasound - CMP in the AM  # Acute Kidney Injury- Resolved  Dispo: Anticipated discharge in approximately 0-1 day(s).   Dr. Verdene Lennert Internal Medicine PGY-1  Pager: [phone] 05/27/2019, 7:48 AM

## 2019-05-27 NOTE — TOC Initial Note (Signed)
Transition of Care [facility]) - Initial/Assessment Note    Patient Details  Name: Robert Ballard MRN: [id number] Date of Birth: 05/08/1985  Transition of Care [facility]) CM/SW Contact:    Bethann Berks[place], [place] Phone Number: 05/27/2019, 2:24 PM  Clinical Narrative:                  CSW met with pt regarding med assistance. Pt lives at home with his wife. Confirms that he was seen at [facility] clinic after his last hospital visit 6 months ago. Pt gives permission for CSW to establish hospital followup appointment with [facility]. CSW explains that pt should be able to have his meds prescribed by his PCP to the [facility] and [place] for assistance. Explained that due to pt using his [place] 6 months ago he may not be eligible for med assistance immediately following hospital though CSW would follow up about override. CSW also gives pt resources for [employer] and explains the importance of maintaining relationship with PCP.  CSW calls [facility] clinic and schedules appointment for telephone on Wed 05/30/19 at 950pm. CSW is informed that pt has one no show so far and if he has two more he would not be able to be seen at clinic. CSW informs pt of his appointment time/date and no show policy.   Expected Discharge Plan: Home/Self Care Barriers to Discharge: No Barriers Identified   Patient Goals and CMS Choice        Expected Discharge Plan and Services Expected Discharge Plan: Home/Self Care                                              Prior Living Arrangements/Services     Patient language and need for interpreter reviewed:: Yes Do you feel safe going back to the place where you live?: Yes      Need for Family Participation in Patient Care: No (Comment) Care giver support system in place?: Yes (comment)   Criminal Activity/Legal Involvement Pertinent to Current Situation/Hospitalization: No - Comment as needed  Activities of Daily Living Home Assistive  Devices/Equipment: None ADL Screening (condition at time of admission) Patient's cognitive ability adequate to safely complete daily activities?: Yes Is the patient deaf or have difficulty hearing?: No Does the patient have difficulty seeing, even when wearing glasses/contacts?: No Does the patient have difficulty concentrating, remembering, or making decisions?: No Patient able to express need for assistance with ADLs?: Yes Does the patient have difficulty dressing or bathing?: No Independently performs ADLs?: Yes (appropriate for developmental age) Does the patient have difficulty walking or climbing stairs?: No Weakness of Legs: None Weakness of Arms/Hands: None  Permission Sought/Granted   Permission granted to share information with : Yes, Verbal Permission Granted     Permission granted to share info w AGENCY: [facility]        Emotional Assessment Appearance:: Appears stated age Attitude/Demeanor/Rapport: Engaged Affect (typically observed): Pleasant, Calm Orientation: : Oriented to Self, Oriented to Place, Oriented to  Time, Oriented to Situation Alcohol / Substance Use: Not Applicable Psych Involvement: No (comment)  Admission diagnosis:  Abdominal pain [R10.9] Diabetic ketoacidosis without coma associated with type 1 diabetes mellitus ([place]) [E10.10] Diabetic ketoacidosis associated with type 2 diabetes mellitus ([place]) [E11.10] Patient Active Problem List   Diagnosis Date Noted  . Diabetic ketoacidosis associated with type 2 diabetes  mellitus ([place]) 05/24/2019  . Hypertension   . Leukocytosis   . Hyperkalemia   . Diabetes mellitus with macroalbuminuric diabetic nephropathy ([place])   . DKA (diabetic ketoacidoses) ([place]) 05/10/2017   PCP:  Patient, No Pcp Per Pharmacy:   [facility] [place], [place] [address] [place] [address] Phone: [phone] Fax:  [phone]     Social Determinants of Health (SDOH) Interventions    Readmission Risk Interventions Readmission Risk Prevention Plan 10/05/2018  Post Dischage Appt Complete  Medication Screening Complete  Transportation Screening Complete  Some recent data might be hidden

## 2019-05-28 DIAGNOSIS — R748 Abnormal levels of other serum enzymes: Secondary | ICD-10-CM

## 2019-05-28 LAB — COMPREHENSIVE METABOLIC PANEL
ALT: 51 U/L — ABNORMAL HIGH (ref 0–44)
AST: 24 U/L (ref 15–41)
Albumin: 2.3 g/dL — ABNORMAL LOW (ref 3.5–5.0)
Alkaline Phosphatase: 170 U/L — ABNORMAL HIGH (ref 38–126)
Anion gap: 10 (ref 5–15)
BUN: 9 mg/dL (ref 6–20)
CO2: 26 mmol/L (ref 22–32)
Calcium: 8.8 mg/dL — ABNORMAL LOW (ref 8.9–10.3)
Chloride: 102 mmol/L (ref 98–111)
Creatinine, Ser: 0.72 mg/dL (ref 0.61–1.24)
GFR calc Af Amer: >60 mL/min (ref >60)
GFR calc non Af Amer: >60 mL/min (ref >60)
Glucose, Bld: 254 mg/dL — ABNORMAL HIGH (ref 70–99)
Potassium: 3.6 mmol/L (ref 3.5–5.1)
Sodium: 138 mmol/L (ref 135–145)
Total Bilirubin: 1 mg/dL (ref 0.3–1.2)
Total Protein: 5.9 g/dL — ABNORMAL LOW (ref 6.5–8.1)

## 2019-05-28 LAB — HEPATITIS B SURFACE ANTIBODY, QUANTITATIVE: Hep B S AB Quant (Post): <3.1 m[IU]/mL — ABNORMAL LOW (ref >9.9)

## 2019-05-28 LAB — GLUCOSE, CAPILLARY: Glucose-Capillary: 206 mg/dL — ABNORMAL HIGH (ref 70–99)

## 2019-05-28 LAB — TRIGLYCERIDES: Triglycerides: 380 mg/dL — ABNORMAL HIGH (ref <150)

## 2019-05-28 MED ORDER — ROSUVASTATIN CALCIUM 10 MG PO TABS: 10.0000 mg | ORAL_TABLET | Freq: Every day | ORAL | 2 refills | Status: DC

## 2019-05-28 MED ORDER — NOVOLIN 70/30 RELION (70-30) 100 UNIT/ML ~~LOC~~ SUSP: 30.0000 [IU] | Freq: Two times a day (BID) | SUBCUTANEOUS | 2 refills | Status: DC

## 2019-05-28 MED ORDER — METFORMIN HCL 500 MG PO TABS: 500.0000 mg | ORAL_TABLET | Freq: Two times a day (BID) | ORAL | 11 refills | Status: DC

## 2019-05-28 MED ORDER — LISINOPRIL 10 MG PO TABS
10.0000 mg | ORAL_TABLET | Freq: Every day | ORAL | 2 refills | Status: AC
Start: 2019-05-28 — End: ?

## 2019-05-28 NOTE — Progress Notes (Signed)
   Subjective:   Patient reports that his abdominal pain has improved. Reports a headache last night. We discussed his labs today. Discussed that he will need to start on a new medication on discharge. He expressed understanding.   Objective:  Vital signs in last 24 hours: Vitals:   05/27/19 1114 05/27/19 1951 05/27/19 2337 05/28/19 0418  BP: 139/80 121/78 (!) 142/69 (!) 110/57  Pulse: 94   91  Resp: 19 20 16 19   Temp: 98.4 F (36.9 C) 98.9 F (37.2 C) 98.2 F (36.8 C) 98.6 F (37 C)  TempSrc: Oral Oral Oral Oral  SpO2: 97% 97% 94% 97%  Weight:      Height:        Physical Exam Vitals and nursing note reviewed.  Constitutional:      General: He is not in acute distress.    Appearance: He is obese.  Pulmonary:     Effort: Pulmonary effort is normal. No respiratory distress.  Skin:    General: Skin is warm and dry.  Neurological:     General: No focal deficit present.     Mental Status: He is alert and oriented to person, place, and time. Mental status is at baseline.  Psychiatric:        Mood and Affect: Mood normal.        Behavior: Behavior normal.   Assessment/Plan:  Active Problems:   Diabetic ketoacidosis associated with type 2 diabetes mellitus [facility])  [name] is a 34 y/o male with a PMHx of uncontrolled T2DM with previous DKA episodes x 2 complicated by diabetic nephropathy, who presents to the ED with c/o nausea,vomiting and now admitted for evaluation and management of anion gap metabolic acidosis with hyperglycemia, likely DKA.   # Acute Pancreatitis # Hypertriglyceridemia # Eruptive xanthomas Lipase elevated at > 700 on admission.Lipid panel consistent with hypertriglyceridemia with triglycerides of 2200. Unfortunately, patient does know know much of his family history, making this difficult to assess if secondary to uncontrolled T2DM versus a primary cause such as familial.Most likely a combination of both.   - [place] Novolin 70/30, 20  units BID - Rosuvastatin 10 mg daily - Will recommend a fibrate be started by PCP.  - Discharge today   # Type 2 Diabetes Mellitus with Diabetic Nephropathy  #Diabetic Ketoacidosis- Resolved Trigger may be due to medication non-compliance (a1c has been ~14% for 2 years now, likely from poor disease process understanding) versus acutepancreatitis.  - Insulin as noted above - Monitor potassium; replenish PRN -Continue Lisinopril - Will recommend Metformin to be started by PCP   # Hepatic Steatosis # Elevated LFTs Cholecystic pattern but asymptomatic. Likely reactive to DKA  # Acute Kidney Injury- Resolved   Dispo: Anticipated discharge today.   Dr. 03-04-1970 Internal Medicine PGY-1  Pager: [phone] 05/28/2019, 6:52 AM

## 2019-05-28 NOTE — Progress Notes (Signed)
Inpatient Diabetes Program Recommendations  AACE/ADA: New Consensus Statement on Inpatient Glycemic Control   Target Ranges:  Prepandial:   less than 140 mg/dL      Peak postprandial:   less than 180 mg/dL (1-2 hours)      Critically ill patients:  140 - 180 mg/dL   Results for KIAM, BRANSFIELD (MRN [id number]) as of 05/28/2019 08:49  Ref. Range 05/27/2019 07:12 05/27/2019 08:59 05/27/2019 11:09 05/27/2019 17:13 05/27/2019 21:25 05/28/2019 08:15  Glucose-Capillary Latest Ref Range: 70 - 99 mg/dL 469 (H) 507 (H) 225 (H) 205 (H) 165 (H) 206 (H)   Review of Glycemic Control  Diabetes history: DM2 Outpatient Diabetes medications: 70/30 20-25 units BID Current orders for Inpatient glycemic control: 70/30 20 units BID  Inpatient Diabetes Program Recommendations:   Insulin-Correction: Please consider ordering Novolog 0-9 units TID with meals and Novolog 0-5 units QHS.  HbgA1C:  A1C 14% on 05/24/19 indicating an average glucose of 355 mg/dl over the past 2-3 months.  Thanks, Orlando Penner, RN, MSN, CDE Diabetes Coordinator Inpatient Diabetes Program [phone] (Team Pager from 8am to 5pm)

## 2019-05-28 NOTE — Discharge Instructions (Signed)
Robert Ballard,   It was a pleasure meeting you and we are glad you are feeling better!   You were admitted to the hospital due to DKA and acute pancreatitis caused by high triglyceride levels. Please make sure to use 20 units of your insulin twice daily. In addition, we started you on a new medication called Rosuvastatin (Crestor) to help with your triglyceride levels. Please make sure to follow up with your primary care doctor.

## 2019-05-28 NOTE — Discharge Summary (Signed)
Name: Robert Ballard MRN: [id number] DOB: [birth year] 34 y.o. PCP: Patient, No Pcp Per  Date of Admission: 05/24/2019 10:26 AM Date of Discharge:  05/28/2019 Attending Physician: Sid Falcon, MD  Discharge Diagnosis: 1. DKA 2. Acute Pancreatitis 3. Hypertriglyceridemia  4. AKI 5. Hepatic Steatosis   Discharge Medications: Allergies as of 05/28/2019   No Known Allergies     Medication List    TAKE these medications   lisinopril 10 MG tablet Commonly known as: ZESTRIL Take 1 tablet (10 mg total) by mouth daily.   NovoLIN 70/30 ReliOn (70-30) 100 UNIT/ML injection Generic drug: insulin NPH-regular Human Inject 30 Units into the skin 2 (two) times daily with a meal.   rosuvastatin 10 MG tablet Commonly known as: CRESTOR Take 1 tablet (10 mg total) by mouth daily.   True Metrix Blood Glucose Test test strip Generic drug: glucose blood   UltiCare Insulin Syringe 30G X 1/2" 0.3 ML Misc Generic drug: Insulin Syringe-Needle U-100       Disposition and follow-up:   Robert Ballard was discharged from [facility] in Good condition.  At the hospital follow up visit please address:  1.    CONSIDER ADDING METFORMIN AND A FIBRATE DISCUSS IMPROVING GLYCEMIC CONTROL  2.  Labs / imaging needed at time of follow-up: CMP, Lipid Panel  3.  Pending labs/ test needing follow-up: None  Follow-up Appointments: Follow-up Information    [facility]. Go to.   Why: Appointment on 5/26 Contact information: [address], [address] [place] [address]          Hospital Course by problem list: 1. DKA Mr. Ballard Robert is a 34 y/o male with a PMHx of uncontrolled T2DM with previous DKA episodes x 2 complicated by diabetic nephropathy, who presents to the ED with c/o nausea,vomiting and admitted for evaluation and management of anion gap metabolic acidosis with hyperglycemia secondary to  DKA. Initial beta-hydroxybutyric acid of > 6.0. Patient was started on IV insulin per [employer] protocol. Gap closed x2 with resolution in beta-hydroxybutyric acid within 24 hours. Trigger may have been due to medication non-compliance (a1c has been ~14% for 2 years now, likely from poor disease process understanding) versus acute abdominal process given acute pancreatitis.   2. Acute Pancreatitis Significant tenderness on examination on admission. Lipase markedly elevated at > 700.Lipid panel consistent with hypertriglyceridemia with triglycerides of 2200. Pain improved gradually over 3 days. Please see below.  3. Hypertriglyceridemia  Hypertriglyceridemia with triglycerides of 2275 on admission. He was started on IV insulin rather than apheresis given he required it for DKA as well. Patient's triglyceride levels improved >50% within 24 hours. Over next 3 days, patient was transitioned to [facility] insulin once trigyceride levels were below 500. In addition, patient was started on Statin. Will likely need a fibrate, but given difficulty with medication compliance, he would benefit from a gradual addition of new medications. Unfortunately, patient does know know much of his family history, making this difficult to assess if secondary to uncontrolled T2DM versus a primary cause such as familial, such as Familial chylomicronemia syndrome. Most likely a combination of both primary and secondary causes.  4. AKI Secondary to hypovolemia in light of DKA. Resolved with fluids.   5. Hepatic Steatosis  Mild elevations in LFTs on Day 3 of admission. RUQ ultrasound obtained and showed hepatic steatosis; no signs of acute cholecystitis. Likely reactive with DKA.    Discharge Vitals:   BP 131/90 (  BP Location: Right Arm)   Pulse (!) 107   Temp 97.7 F (36.5 C)   Resp 16   Ht 5\' 8"  (1.727 m)   Wt 99.8 kg   SpO2 98%   BMI 33.45 kg/m   Pertinent Labs, Studies, and Procedures:   CBC Latest Ref Rng & Units  05/27/2019 05/26/2019 05/25/2019  WBC 4.0 - 10.5 K/uL 11.5(H) 11.1(H) 15.1(H)  Hemoglobin 13.0 - 17.0 g/dL 07/25/2019 76.2 83.1  Hematocrit 39.0 - 52.0 % 41.5 39.3 43.2  Platelets 150 - 400 K/uL 314 252 295   CMP Latest Ref Rng & Units 05/28/2019 05/27/2019 05/26/2019  Glucose 70 - 99 mg/dL 07/26/2019) 616(W) 737(T)  BUN 6 - 20 mg/dL 9 5(L) 6  Creatinine 062(I - 1.24 mg/dL 9.48 5.46) 2.70(J  Sodium 135 - 145 mmol/L 138 139 133(L)  Potassium 3.5 - 5.1 mmol/L 3.6 3.2(L) 3.2(L)  Chloride 98 - 111 mmol/L 102 105 102  CO2 22 - 32 mmol/L 26 22 24   Calcium 8.9 - 10.3 mg/dL 5.00) ) 8.1(L)  Total Protein 6.5 - 8.1 g/dL 5.9(L) 5.8(L) 5.6(L)  Total Bilirubin 0.3 - 1.2 mg/dL 1.0 0.6 0.6  Alkaline Phos 38 - 126 U/L 170(H) 172(H) 145(H)  AST 15 - 41 U/L 24 43(H) 42(H)  ALT 0 - 44 U/L 51(H) 59(H) 35   Lab Results  Component Value Date   LIPASE 712 (H) 05/24/2019   Maximum Triglyceride: 2275  Lab Results  Component Value Date   CHOL 611 (H) 05/24/2019   Lab Results  Component Value Date   HDL NOT REPORTED DUE TO HIGH TRIGLYCERIDES 05/24/2019   Lab Results  Component Value Date   LDLCALC NOT CALCULATED 05/24/2019   Lab Results  Component Value Date   TRIG 380 (H) 05/28/2019   TRIG 441 (H) 05/27/2019   TRIG 435 (H) 05/27/2019   Lab Results  Component Value Date   CHOLHDL NOT REPORTED DUE TO HIGH TRIGLYCERIDES 05/24/2019   No results found for: LDLDIRECT  RUQ Ultrasound FINDINGS: Gallbladder:  No gallstones or wall thickening visualized. No sonographic Murphy sign noted by sonographer.  Common bile duct:  Diameter: 4.4 mm.  Liver:  Increased echogenicity is noted. There is an area of decreased echogenicity near the gallbladder fossa consistent with focal fatty sparing. Portal vein is patent on color Doppler imaging with normal direction of blood flow towards the liver.  Other: None.  IMPRESSION: Fatty infiltration of the liver as described.  Discharge  Instructions: Discharge Instructions    Call MD for:  difficulty breathing, headache or visual disturbances   Complete by: As directed    Call MD for:  persistant dizziness or light-headedness   Complete by: As directed    Call MD for:  persistant nausea and vomiting   Complete by: As directed    Call MD for:  severe uncontrolled pain   Complete by: As directed    Call MD for:  temperature >100.4   Complete by: As directed    Diet - low sodium heart healthy   Complete by: As directed    Increase activity slowly   Complete by: As directed       Signed: Dr. 05/29/2019 Internal Medicine PGY-1  Pager: [phone] 05/28/2019, 9:42 AM

## 2019-06-03 ENCOUNTER — Encounter: Payer: Self-pay | Admitting: Internal Medicine

## 2019-06-09 ENCOUNTER — Other Ambulatory Visit: Payer: Self-pay

## 2019-06-09 ENCOUNTER — Encounter: Payer: Self-pay | Admitting: Internal Medicine

## 2019-06-09 ENCOUNTER — Ambulatory Visit (INDEPENDENT_AMBULATORY_CARE_PROVIDER_SITE_OTHER): Payer: Self-pay | Admitting: Internal Medicine

## 2019-06-09 VITALS — BP 133/86 | HR 111 | Temp 98.4°F | Resp 17 | Ht 64.0 in | Wt 241.0 lb

## 2019-06-09 DIAGNOSIS — E1110 Type 2 diabetes mellitus with ketoacidosis without coma: Secondary | ICD-10-CM

## 2019-06-09 DIAGNOSIS — Z794 Long term (current) use of insulin: Secondary | ICD-10-CM

## 2019-06-09 DIAGNOSIS — Z09 Encounter for follow-up examination after completed treatment for conditions other than malignant neoplasm: Secondary | ICD-10-CM

## 2019-06-09 DIAGNOSIS — K760 Fatty (change of) liver, not elsewhere classified: Secondary | ICD-10-CM

## 2019-06-09 DIAGNOSIS — E1165 Type 2 diabetes mellitus with hyperglycemia: Secondary | ICD-10-CM

## 2019-06-09 DIAGNOSIS — E781 Pure hyperglyceridemia: Secondary | ICD-10-CM

## 2019-06-09 LAB — GLUCOSE, POCT (MANUAL RESULT ENTRY): POC Glucose: 231 mg/dl — AB (ref 70–99)

## 2019-06-09 MED ORDER — METFORMIN HCL 1000 MG PO TABS: 1000.0000 mg | ORAL_TABLET | Freq: Two times a day (BID) | ORAL | 11 refills | Status: AC

## 2019-06-09 NOTE — Patient Instructions (Addendum)
Increase Metformin to 1000 mg twice per day. I sent in a new prescription for this. Until you pick this up, take two tablets of your current medication twice per day.   Aumente la metformina a 1000 mg [employer]. Envi una nueva receta para esto. Hasta que recoja esto, tome dos tabletas de su medicamento actual [employer] al da.

## 2019-06-09 NOTE — Progress Notes (Signed)
Subjective:    Robert Ballard - 34 y.o. male MRN [id number]  Date of birth: May 11, 1985  HPI  Robert Ballard is here for hospital discharge f/u. Was hospitalized from 5/10-5/14 for DKA and acute pancreatitis. Patient has a history of noncompliance and uncontrolled DM with A1c of 14% for two years.   Patient reports that he is feeling a whole lot better. No issues with taking his insulin or his medications. Fasting CBGs 180-220s. Never been lower than 150. Takes Metformin 500 mg BID, reports has never been on a higher dose. Taking Insulin NPH 30u BID. Prior to hospitalization, was taking 20u BID. Prior to hospitalization, says that his fasting CBGs were always above 300.   Hospital Course by problem list: 1. DKA [name] is a 34 y/o male with a PMHx of uncontrolled T2DM with previous DKA episodes x 2 complicated by diabetic nephropathy, who presents to the ED with c/o nausea,vomiting and admitted for evaluation and management of anion gap metabolic acidosis with hyperglycemia secondary to DKA. Initial beta-hydroxybutyric acid of > 6.0. Patient was started on IV insulin per [employer] protocol. Gap closed x2 with resolution in beta-hydroxybutyric acid within 24 hours. Trigger may have been due to medication non-compliance (a1c has been ~14% for 2 years now, likely from poor disease process understanding) versus acute abdominal process given acute pancreatitis.   2. Acute Pancreatitis Significant tenderness on examination on admission. Lipase markedly elevated at > 700.Lipid panel consistent with hypertriglyceridemia with triglycerides of 2200.Pain improved gradually over 3 days. Please see below.  3. Hypertriglyceridemia  Hypertriglyceridemia with triglycerides of 2275 on admission. He was started on IV insulin rather than apheresis given he required it for DKA as well. Patient's triglyceride levels improved >50% within 24 hours. Over next 3 days, patient  was transitioned to [facility] insulin once trigyceride levels were below 500. In addition, patient was started on Statin. Will likely need a fibrate, but given difficulty with medication compliance, he would benefit from a gradual addition of new medications. Unfortunately, patient does know know much of his family history, making this difficult to assess if secondary to uncontrolled T2DM versus a primary cause such as familial, such as Familial chylomicronemia syndrome.Most likely a combination of both primary and secondary causes.  4. AKI Secondary to hypovolemia in light of DKA. Resolved with fluids.   5. Hepatic Steatosis  Mild elevations in LFTs on Day 3 of admission. RUQ ultrasound obtained and showed hepatic steatosis; no signs of acute cholecystitis. Likely reactive with DKA.       Health Maintenance:  Health Maintenance Due  Topic Date Due  . COVID-19 Vaccine (1) Never done    -  reports that he has quit smoking. He has never used smokeless tobacco. - Review of Systems: Per HPI. - Past Medical History: Patient Active Problem List   Diagnosis Date Noted  . Elevated liver enzymes   . Diabetic ketoacidosis associated with type 2 diabetes mellitus ([place]) 05/24/2019  . Hypertension   . Leukocytosis   . Hyperkalemia   . Diabetes mellitus with macroalbuminuric diabetic nephropathy ([place])   . DKA (diabetic ketoacidoses) ([place]) 05/10/2017   - Medications: reviewed and updated   Objective:   Physical Exam BP 133/86   Pulse (!) 111   Temp 98.4 F (36.9 C) (Temporal)   Resp 17   Ht 5\' 4"  (1.626 m)   Wt 241 lb (109.3 kg)   SpO2 95%   BMI 41.37 kg/m  Physical Exam  Constitutional: He  is oriented to person, place, and time and well-developed, well-nourished, and in no distress. No distress.  HENT:  Head: Normocephalic and atraumatic.  Eyes: Conjunctivae and EOM are normal.  Cardiovascular: Normal rate, regular rhythm and normal heart sounds.  No murmur heard. Pulmonary/Chest:  Effort normal and breath sounds normal. No respiratory distress.  Musculoskeletal:        General: Normal range of motion.  Neurological: He is alert and oriented to person, place, and time.  Skin: Skin is warm and dry. He is not diaphoretic.  Psychiatric: Affect and judgment normal.        Assessment & Plan:   1. Hospital discharge follow-up  2. Diabetic ketoacidosis without coma associated with type 2 diabetes mellitus (HCC) Resolved prior to discharge. No symptoms associated with DKA.   3. Hepatic steatosis - Comprehensive metabolic panel  4. Type 2 diabetes mellitus with hyperglycemia, with long-term current use of insulin (HCC) Fasting CBGs remain above goal. However, have improved per patient report since prior to hospitalization. Insulin dose was increased fairly significantly after hospitalization. Therefore, will not adjust at present and plan to increase Metformin to 1000 mg BID. AKI that was present at initial hospitalization resolved with treatment of DKA so does not warrant renal adjustments. Return in one month. If still above goal fasting CBGs, will start to increase insulin dosing.  - Comprehensive metabolic panel - Lipid Panel - Glucose (CBG) - metFORMIN (GLUCOPHAGE) 1000 MG tablet; Take 1 tablet (1,000 mg total) by mouth 2 (two) times daily with a meal.  Dispense: 60 tablet; Refill: 11  5. Hypertriglyceridemia Triglycerides significantly elevated at admission with pancreatitis. At discharge, result was 380. Will repeat. Patient is on Crestor, may benefit from increase in statin intensity pending LDL as well as additional therapy for hypertriglyceridemia.  - Lipid Panel    Marcy Siren, D.O. 06/09/2019, 10:13 AM Primary Care at [facility]

## 2019-06-10 LAB — COMPREHENSIVE METABOLIC PANEL
ALT: 24 IU/L (ref 0–44)
AST: 17 IU/L (ref 0–40)
Albumin/Globulin Ratio: 1.4 (ref 1.2–2.2)
Albumin: 4.4 g/dL (ref 4.0–5.0)
Alkaline Phosphatase: 145 IU/L — ABNORMAL HIGH (ref 48–121)
BUN/Creatinine Ratio: 16 (ref 9–20)
BUN: 12 mg/dL (ref 6–20)
Bilirubin Total: 0.3 mg/dL (ref 0.0–1.2)
CO2: 23 mmol/L (ref 20–29)
Calcium: 9.9 mg/dL (ref 8.7–10.2)
Chloride: 100 mmol/L (ref 96–106)
Creatinine, Ser: 0.73 mg/dL — ABNORMAL LOW (ref 0.76–1.27)
GFR calc Af Amer: 142 mL/min/{1.73_m2} (ref >59)
GFR calc non Af Amer: 123 mL/min/{1.73_m2} (ref >59)
Globulin, Total: 3.2 g/dL (ref 1.5–4.5)
Glucose: 229 mg/dL — ABNORMAL HIGH (ref 65–99)
Potassium: 4.8 mmol/L (ref 3.5–5.2)
Sodium: 139 mmol/L (ref 134–144)
Total Protein: 7.6 g/dL (ref 6.0–8.5)

## 2019-06-10 LAB — LIPID PANEL
Chol/HDL Ratio: 6 ratio — ABNORMAL HIGH (ref 0.0–5.0)
Cholesterol, Total: 238 mg/dL — ABNORMAL HIGH (ref 100–199)
HDL: 40 mg/dL (ref >39)
LDL Chol Calc (NIH): 91 mg/dL (ref 0–99)
Triglycerides: 649 mg/dL (ref 0–149)
VLDL Cholesterol Cal: 107 mg/dL — ABNORMAL HIGH (ref 5–40)

## 2019-06-11 ENCOUNTER — Other Ambulatory Visit: Payer: Self-pay | Admitting: Internal Medicine

## 2019-06-11 MED ORDER — ROSUVASTATIN CALCIUM 40 MG PO TABS: 40.0000 mg | ORAL_TABLET | Freq: Every day | ORAL | 1 refills | Status: AC

## 2019-06-11 MED ORDER — ICOSAPENT ETHYL 1 G PO CAPS
2.0000 g | ORAL_CAPSULE | Freq: Two times a day (BID) | ORAL | 3 refills | Status: AC
Start: 2019-06-11 — End: ?

## 2019-06-13 IMAGING — US US ABDOMEN LIMITED
1 series · 14 of 25 positions shown · non-contrast
Comparison: None in PACs

CLINICAL DATA: Elevated liver function studies

EXAM:
ULTRASOUND ABDOMEN LIMITED RIGHT UPPER QUADRANT

[Series 1: us abdomen limited · 0.26mm/px · 14 of 59 slices shown]
[im 1/59]
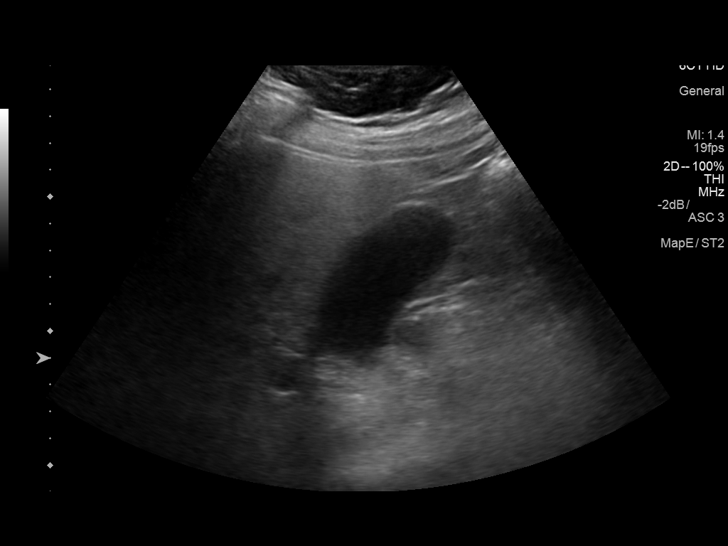
[im 5/59]
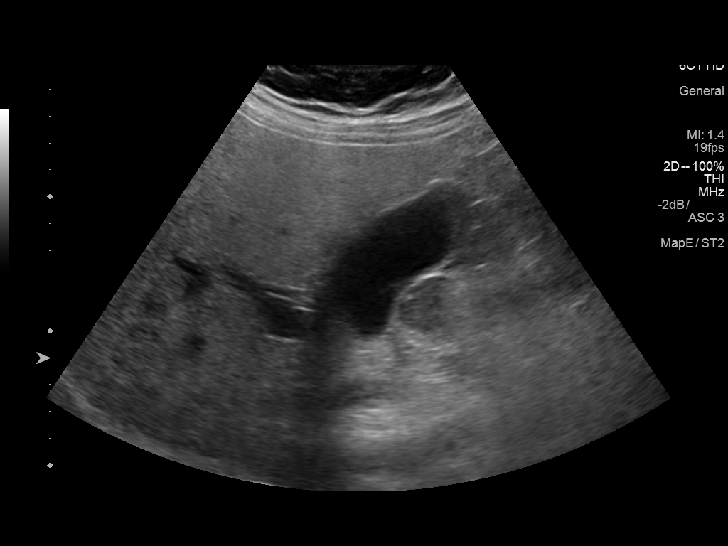
[im 10/59]
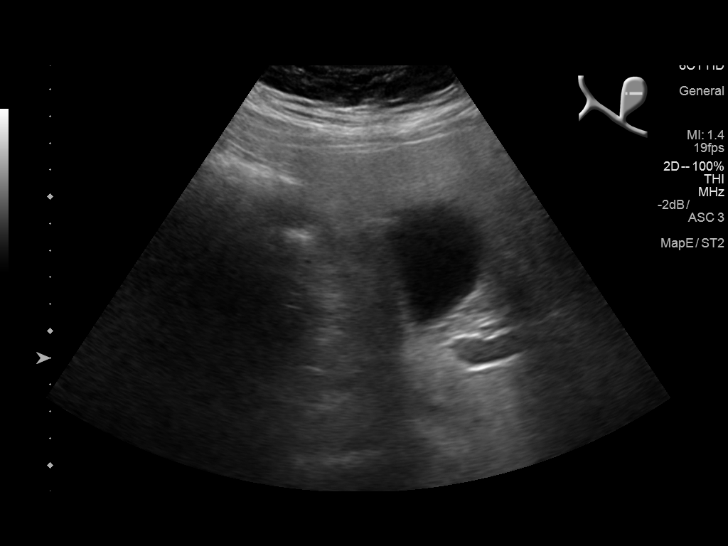
[im 15/59]
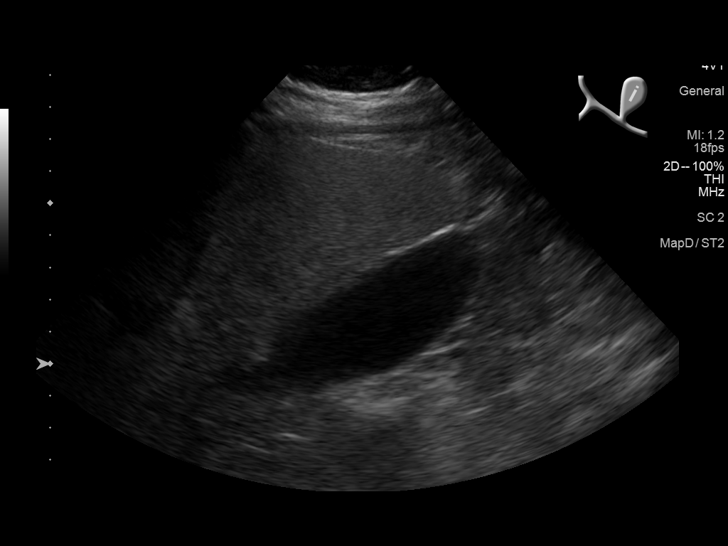
[im 20/59]
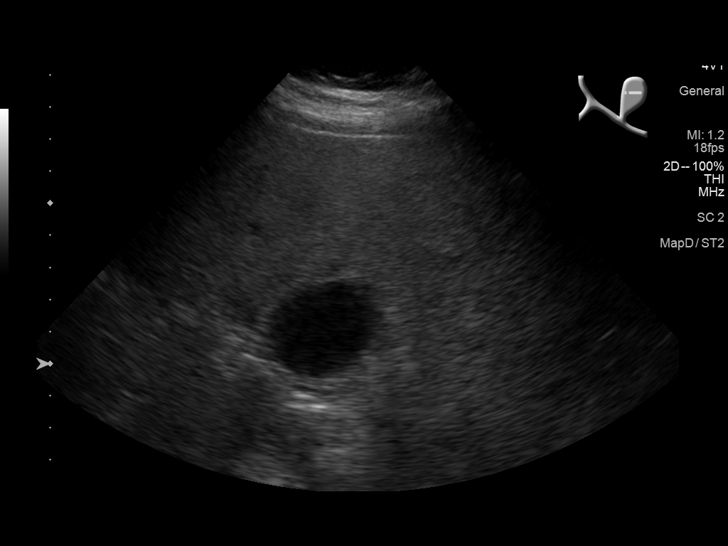
[im 22/59]
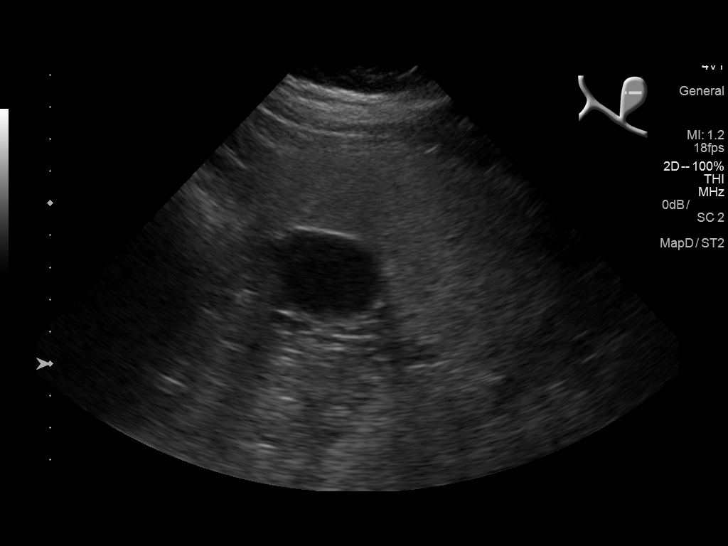
[im 27/59]
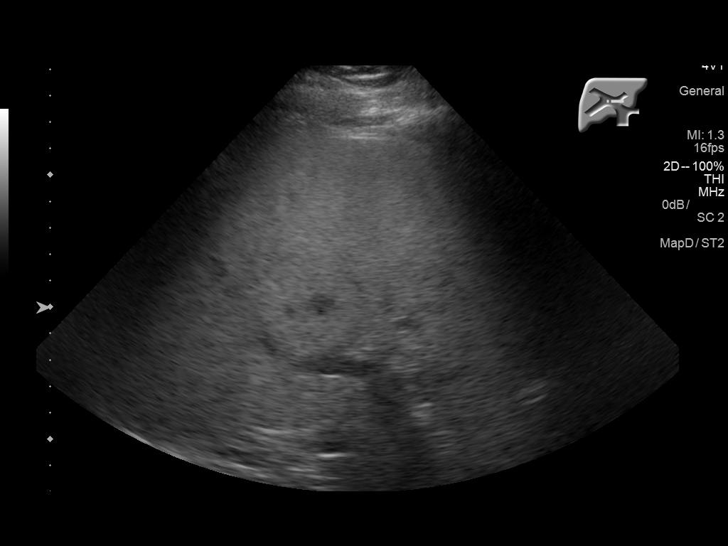
[im 32/59]
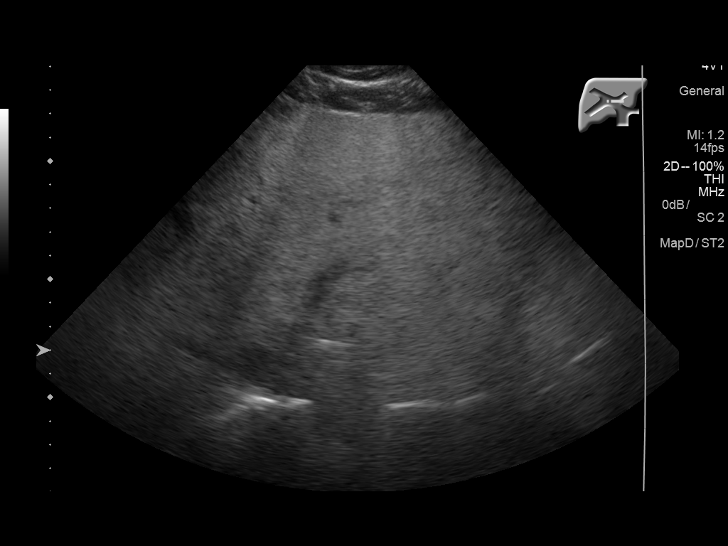
[im 37/59]
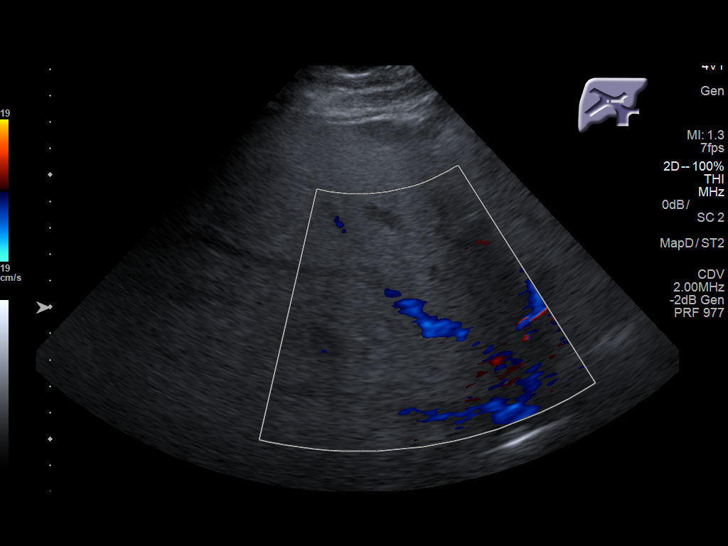
[im 39/59]
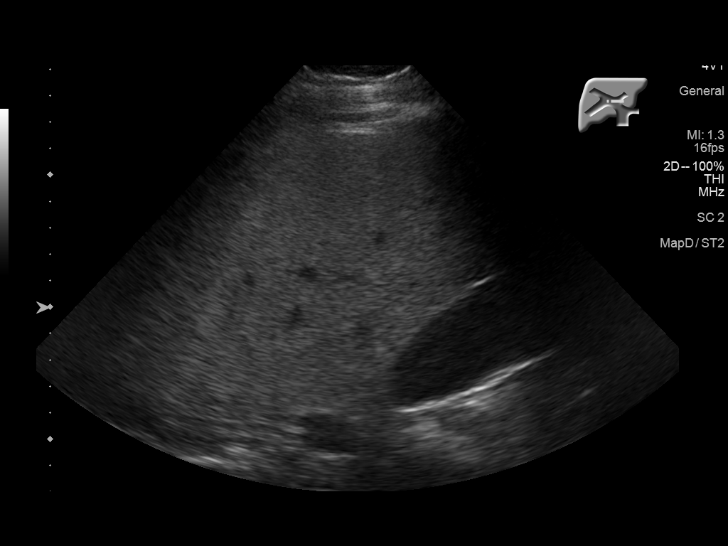
[im 44/59]
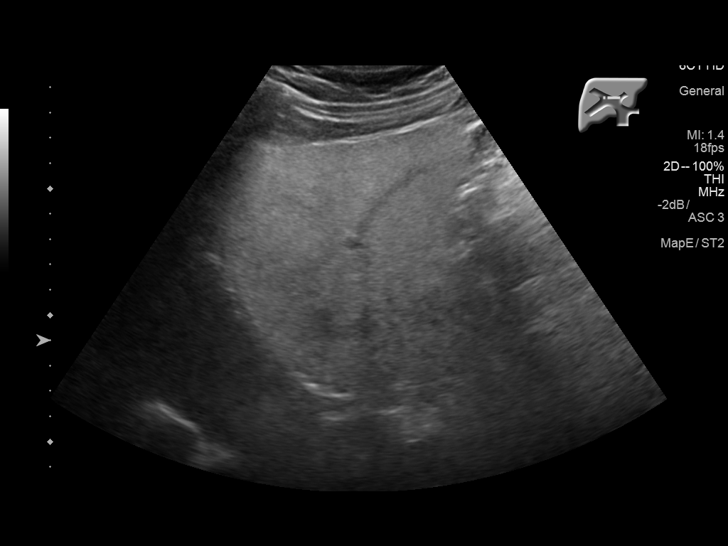
[im 49/59]
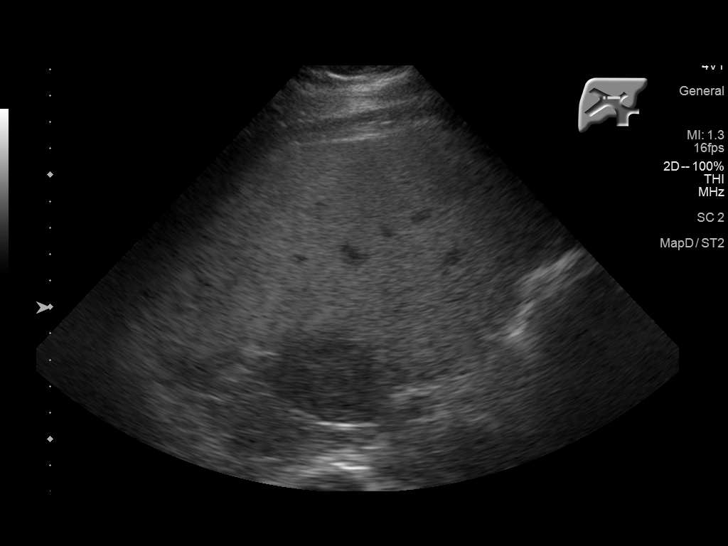
[im 54/59]
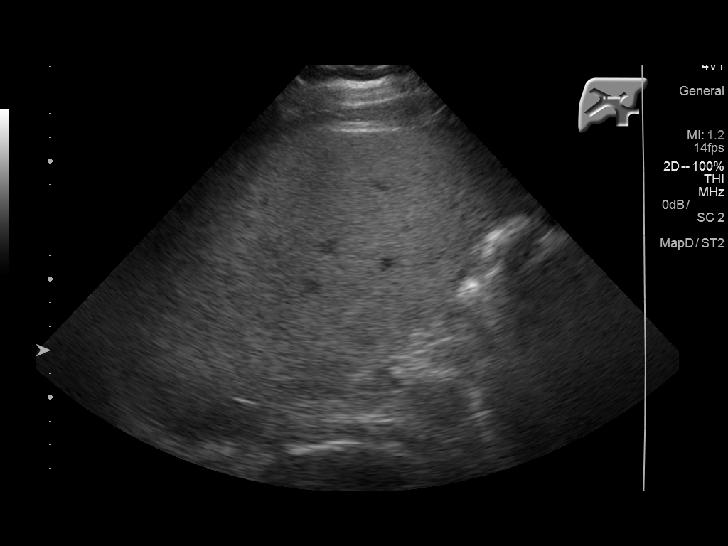
[im 59/59]
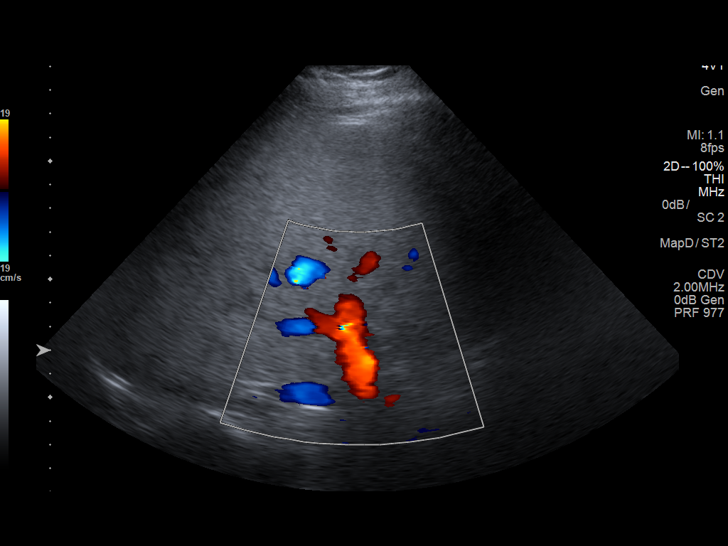

[14 of 25 positions shown; findings below may reference images not displayed]

FINDINGS: Gallbladder:

No gallstones or wall thickening visualized. No sonographic Murphy
sign noted by sonographer.

Common bile duct:

Diameter: 2.5 mm

Liver:

The hepatic echotexture is increased diffusely. The surface contour
is smooth. There is no focal mass or ductal dilation. Portal vein is
patent on color Doppler imaging with normal direction of blood flow
towards the liver.
IMPRESSION: Increased hepatic echotexture most compatible with fatty
infiltrative change. No acute or chronic gallbladder abnormality.

## 2019-07-09 ENCOUNTER — Telehealth (INDEPENDENT_AMBULATORY_CARE_PROVIDER_SITE_OTHER): Payer: Self-pay | Admitting: Internal Medicine

## 2019-07-09 ENCOUNTER — Encounter: Payer: Self-pay | Admitting: Internal Medicine

## 2019-07-09 DIAGNOSIS — Z794 Long term (current) use of insulin: Secondary | ICD-10-CM

## 2019-07-09 DIAGNOSIS — E785 Hyperlipidemia, unspecified: Secondary | ICD-10-CM

## 2019-07-09 DIAGNOSIS — E1165 Type 2 diabetes mellitus with hyperglycemia: Secondary | ICD-10-CM

## 2019-07-09 MED ORDER — NOVOLIN 70/30 RELION (70-30) 100 UNIT/ML ~~LOC~~ SUSP: 32.0000 [IU] | Freq: Two times a day (BID) | SUBCUTANEOUS | 2 refills | Status: AC

## 2019-07-09 NOTE — Progress Notes (Signed)
Virtual Visit via Telephone Note  I connected with Robert Ballard, on 07/09/2019 at 10:06 AM by telephone due to the COVID-19 pandemic and verified that I am speaking with the correct person using two identifiers.   Consent: I discussed the limitations, risks, security and privacy concerns of performing an evaluation and management service by telephone and the availability of in person appointments. I also discussed with the patient that there may be a patient responsible charge related to this service. The patient expressed understanding and agreed to proceed.   Location of Patient: Home   Location of Provider: Clinic    Persons participating in Telemedicine visit: Maximiliano Cromartie [facility] Jeanette Caprice [facility] Dr. Juleen China  Rebecca-interpreter      History of Present Illness: Patient has a visit to follow up on DM.   Diabetes mellitus, Type 2 Disease Monitoring             Blood Sugar Ranges: Fasting - 160-170 avg, never higher than 180, lowest is 150              Polyuria: no              Visual problems: no   Urine Microalbumin 517 (Sept 2020)  Last A1C: 14 (May 24 2019)   Medications: Metformin 1000 mg BID, Insulin NPH 30u BID  Medication Compliance: yes  Medication Side Effects             Hypoglycemia: no      Past Medical History:  Diagnosis Date   Back pain    Diabetes mellitus without complication ([place])    Hypertension    No Known Allergies  Current Outpatient Medications on File Prior to Visit  Medication Sig Dispense Refill   icosapent Ethyl (VASCEPA) 1 g capsule Take 2 capsules (2 g total) by mouth 2 (two) times daily. 120 capsule 3   insulin NPH-regular Human (NOVOLIN 70/30 RELION) (70-30) 100 UNIT/ML injection Inject 30 Units into the skin 2 (two) times daily with a meal. 10 mL 2   lisinopril (ZESTRIL) 10 MG tablet Take 1 tablet (10 mg total) by mouth daily. 30 tablet 2   metFORMIN (GLUCOPHAGE) 1000 MG tablet Take 1 tablet (1,000  mg total) by mouth 2 (two) times daily with a meal. 60 tablet 11   rosuvastatin (CRESTOR) 40 MG tablet Take 1 tablet (40 mg total) by mouth daily. 90 tablet 1   TRUE METRIX BLOOD GLUCOSE TEST test strip      ULTICARE INSULIN SYRINGE 30G X 1/2" 0.3 ML MISC      No current facility-administered medications on file prior to visit.    Observations/Objective: NAD. Speaking clearly.  Work of breathing normal.  Alert and oriented. Mood appropriate.   Assessment and Plan: 1. Type 2 diabetes mellitus with hyperglycemia, with long-term current use of insulin (HCC) A1c very uncontrolled in May 2021 but fasting CBGs now with much better control, although still room for improvement. Will increase NPH to 32u BID. Advised to call for any CBGs <80. Return in about 6 weeks for repeat A1c.  Counseled on Diabetic diet, my plate method, 563 minutes of moderate intensity exercise/week Blood sugar logs with fasting goals of 80-120 mg/dl, random of less than 180 and in the event of sugars less than 60 mg/dl or greater than 400 mg/dl encouraged to notify the clinic. Advised on the need for annual eye exams, annual foot exams, Pneumonia vaccine. - insulin NPH-regular Human (NOVOLIN 70/30 RELION) (70-30) 100 UNIT/ML injection; Inject 32  Units into the skin 2 (two) times daily with a meal.  Dispense: 10 mL; Refill: 2  2. Hyperlipidemia, unspecified hyperlipidemia type On labs obtained at end of May total cholesterol was 238, triglycerides were 649. Increased Crestor to 40 mg and added Vascepa. Unfortunately, patient did not pick these medications up from pharmacy. Emphasized need for compliance and he plans to pick up later today. Will need to monitor in the future.    Follow Up Instructions: DM follow up in 6 weeks    I discussed the assessment and treatment plan with the patient. The patient was provided an opportunity to ask questions and all were answered. The patient agreed with the plan and demonstrated an  understanding of the instructions.   The patient was advised to call back or seek an in-person evaluation if the symptoms worsen or if the condition fails to improve as anticipated.      I provided 24 minutes total of non-face-to-face time during this encounter including median intraservice time, reviewing previous notes, investigations, ordering medications, medical decision making, coordinating care and patient verbalized understanding at the end of the visit.    Marcy Siren, D.O. Primary Care at [facility]  07/09/2019, 10:06 AM

## 2019-08-26 ENCOUNTER — Ambulatory Visit: Payer: Self-pay | Admitting: Internal Medicine

## 2019-11-05 ENCOUNTER — Other Ambulatory Visit: Payer: Self-pay | Admitting: Internal Medicine

## 2019-11-05 DIAGNOSIS — E1165 Type 2 diabetes mellitus with hyperglycemia: Secondary | ICD-10-CM

## 2020-10-22 IMAGING — US US ABDOMEN COMPLETE
1 series · 14 of 25 positions shown · non-contrast
Comparison: Ultrasound 05/12/2017

CLINICAL DATA: Abdomen pain

EXAM:
ABDOMEN ULTRASOUND COMPLETE

[Series 1: us abdomen complete · 14 of 52 slices shown]
[im 1/52]
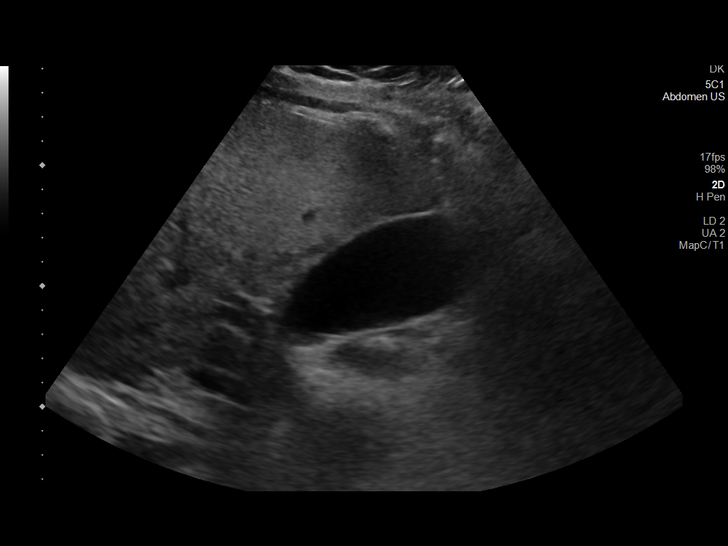
[im 5/52]
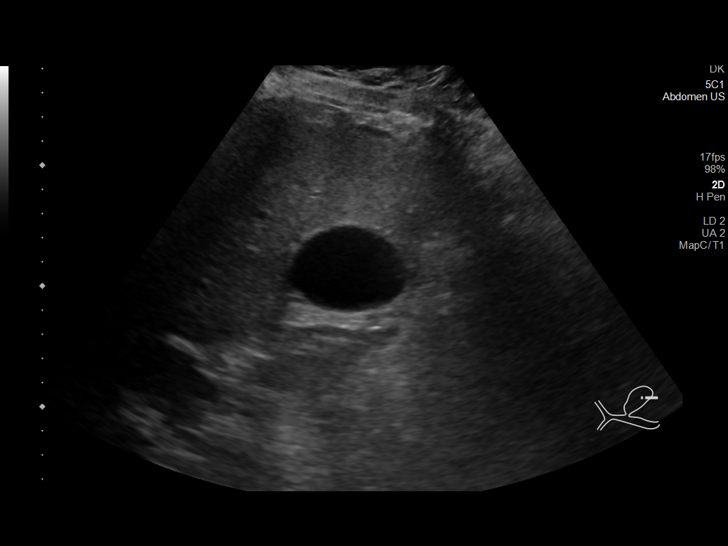
[im 9/52]
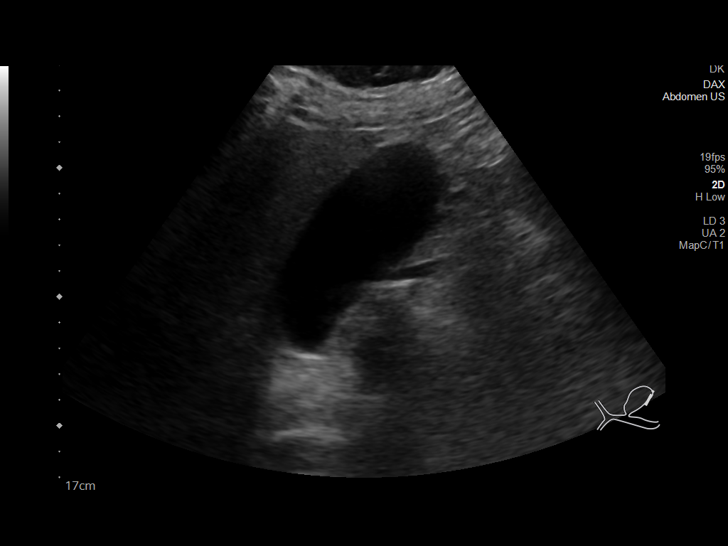
[im 13/52]
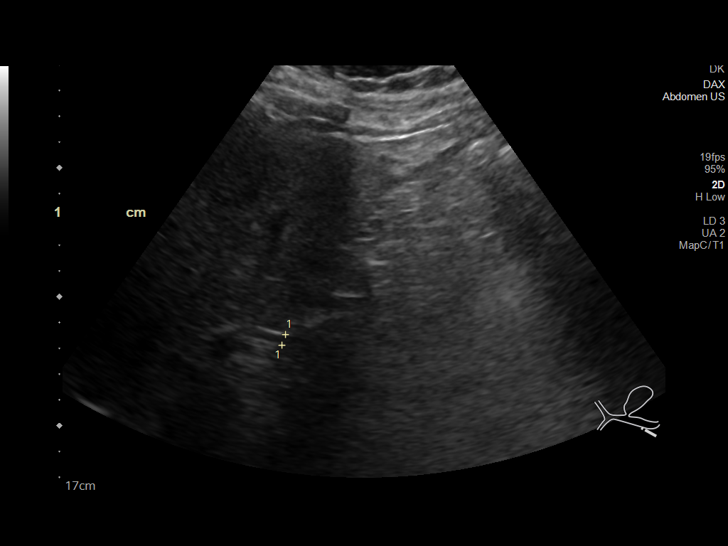
[im 18/52]
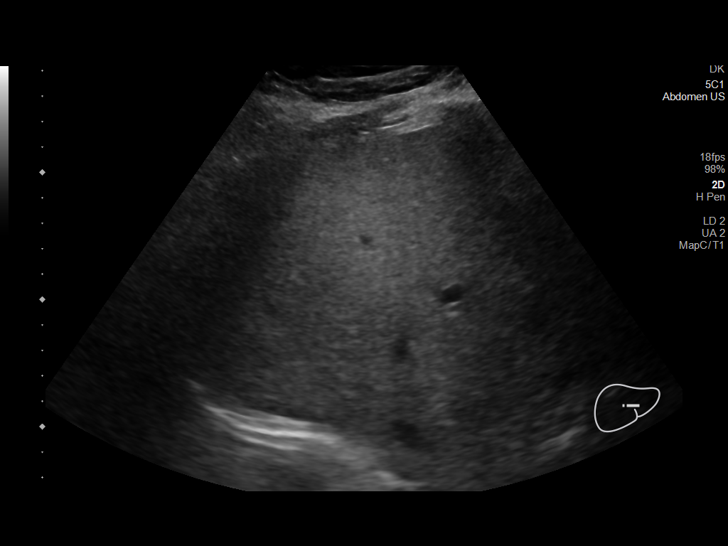
[im 20/52]
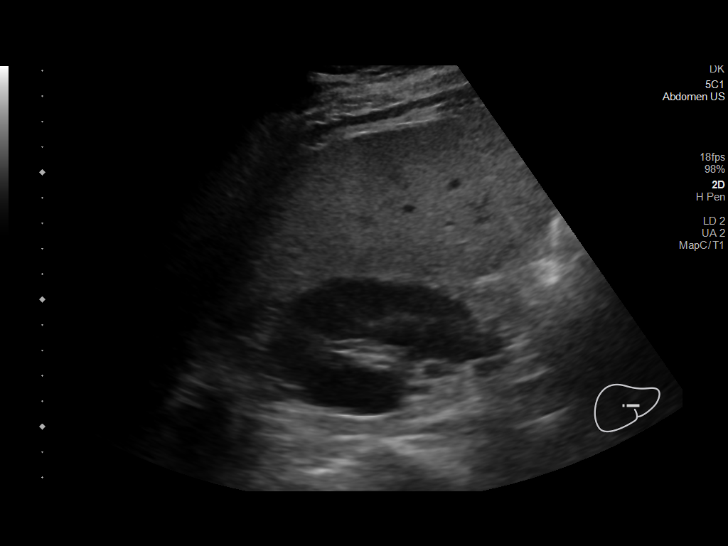
[im 24/52]
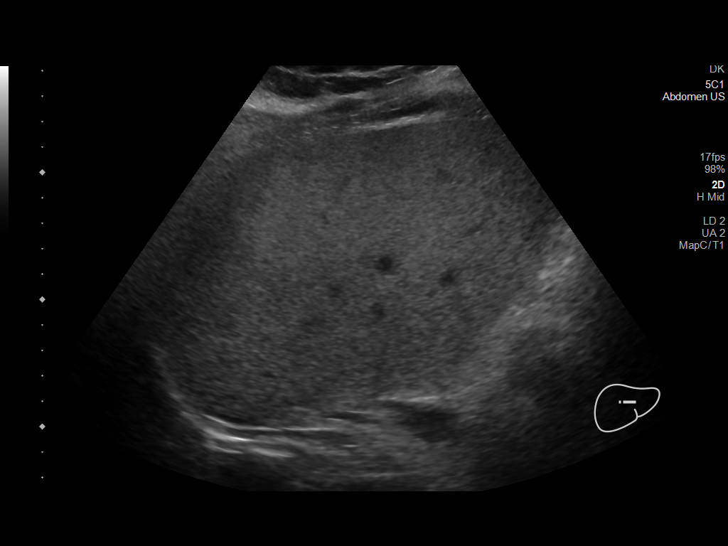
[im 28/52]
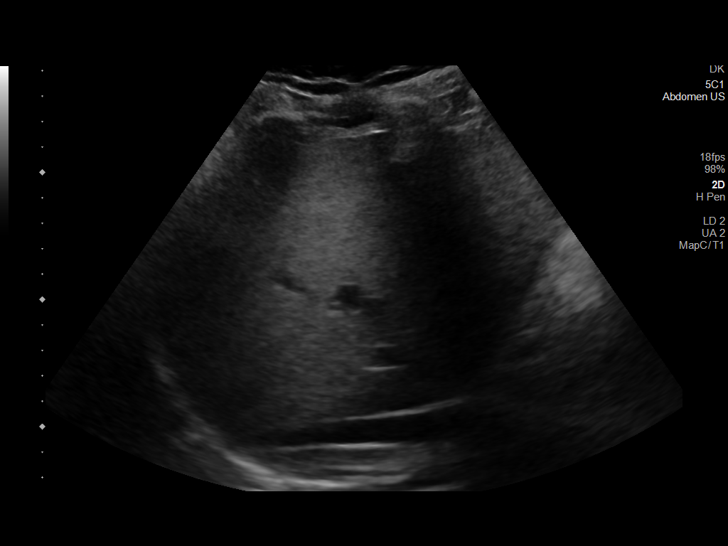
[im 32/52]
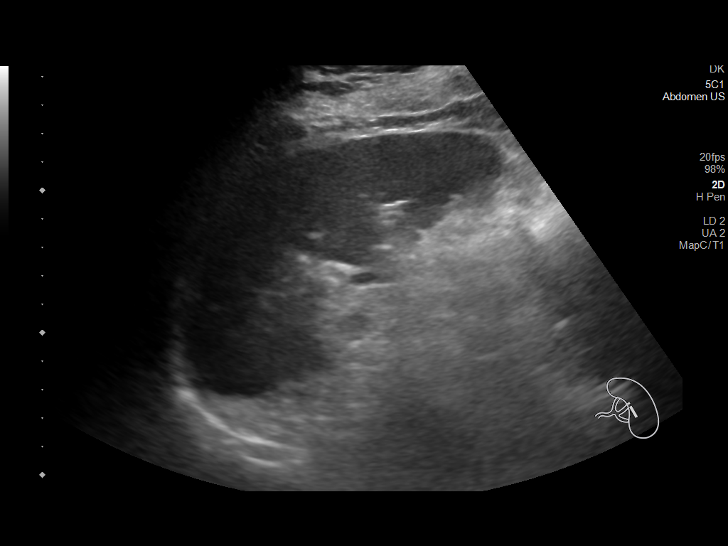
[im 35/52]
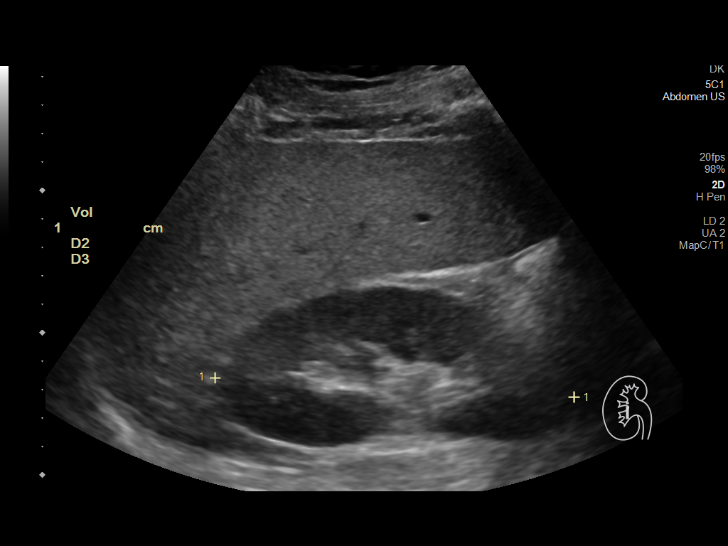
[im 39/52]
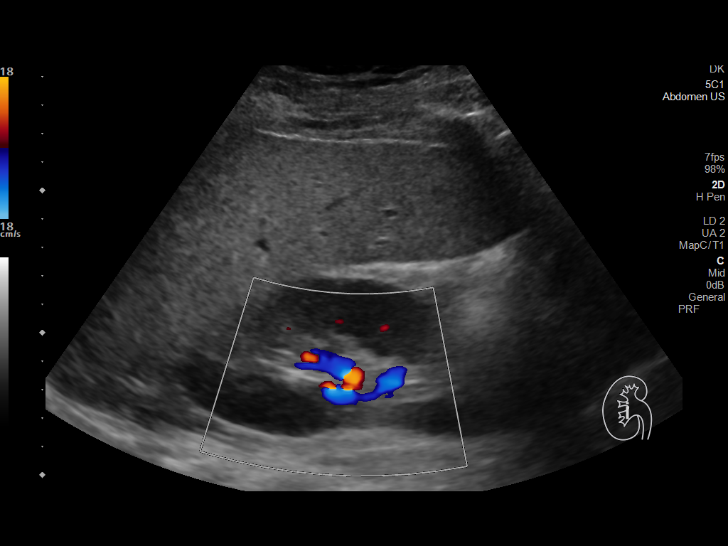
[im 43/52]
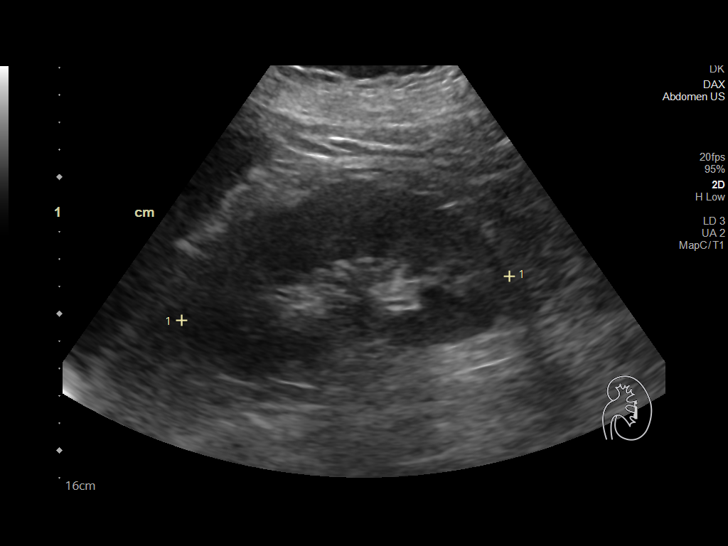
[im 47/52]
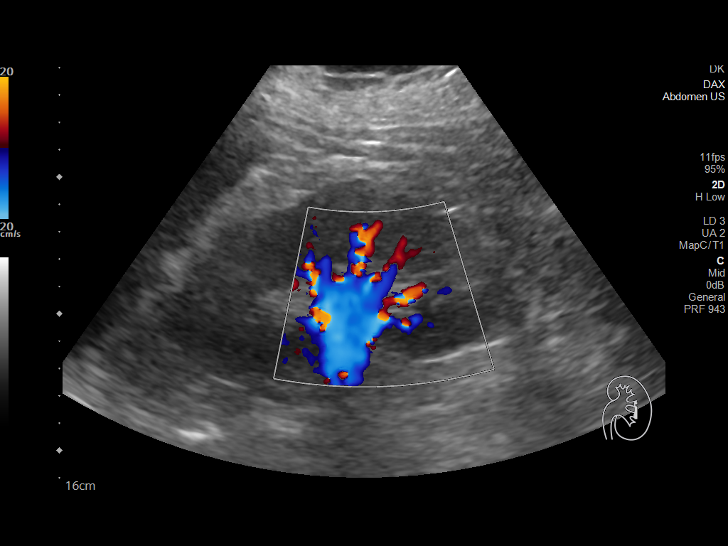
[im 52/52]
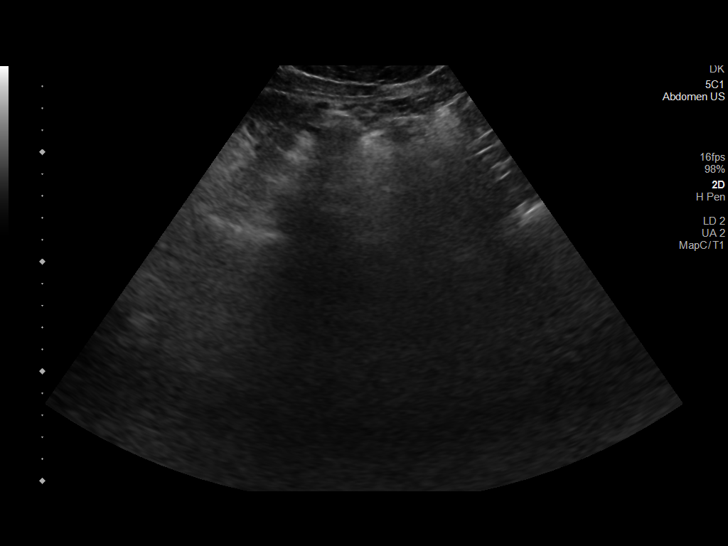

[14 of 25 positions shown; findings below may reference images not displayed]

FINDINGS: Gallbladder: No gallstones or wall thickening visualized. No
sonographic Murphy sign noted by sonographer.

Common bile duct: Diameter: 4.4 mm

Liver: Diffusely echogenic. No focal hepatic abnormality. Portal
vein is patent on color Doppler imaging with normal direction of
blood flow towards the liver.

IVC: No abnormality visualized.

Pancreas: Not well visualized due to bowel gas.

Spleen: Size and appearance within normal limits.

Right Kidney: Length: 12.6 cm. Echogenicity within normal limits. No
mass or hydronephrosis visualized.

Left Kidney: Length: 12.1 cm. Echogenicity within normal limits. No
mass or hydronephrosis visualized.

Abdominal aorta: Mid and distal aorta are obscured by bowel gas.
Maximum proximal aortic diameter of 2.1 cm

Other findings: None.
IMPRESSION: 1. Negative for gallstones or biliary dilatation
2. Diffusely echogenic liver consistent with steatosis.

## 2020-10-25 IMAGING — US US ABDOMEN LIMITED
1 series · 14 of 25 positions shown · non-contrast
Comparison: 05/24/2019

CLINICAL DATA: Elevated LFTs

EXAM:
ULTRASOUND ABDOMEN LIMITED RIGHT UPPER QUADRANT

[Series 1: us abdomen limited · 14 of 47 slices shown]
[im 1/47]
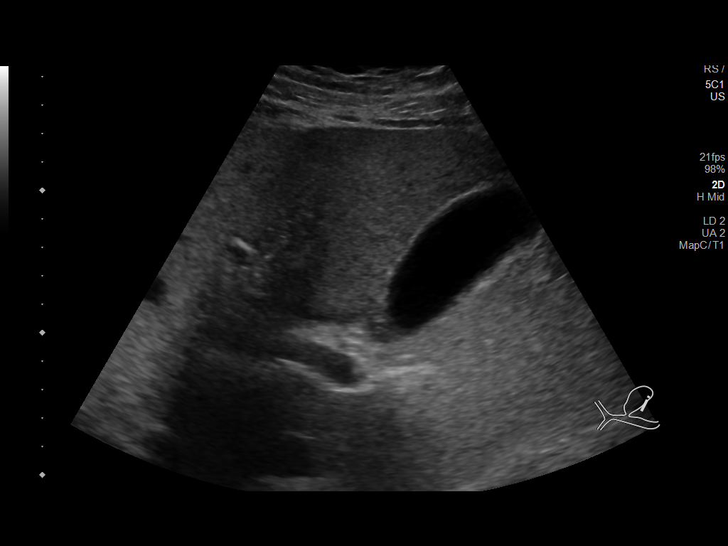
[im 4/47]
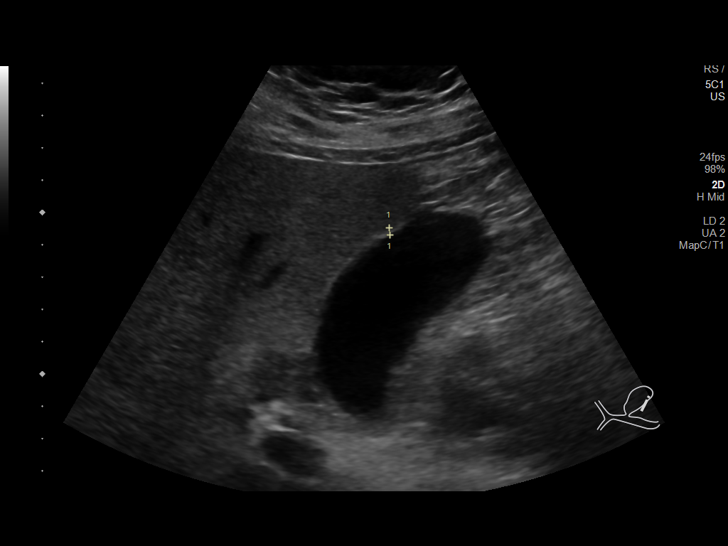
[im 8/47]
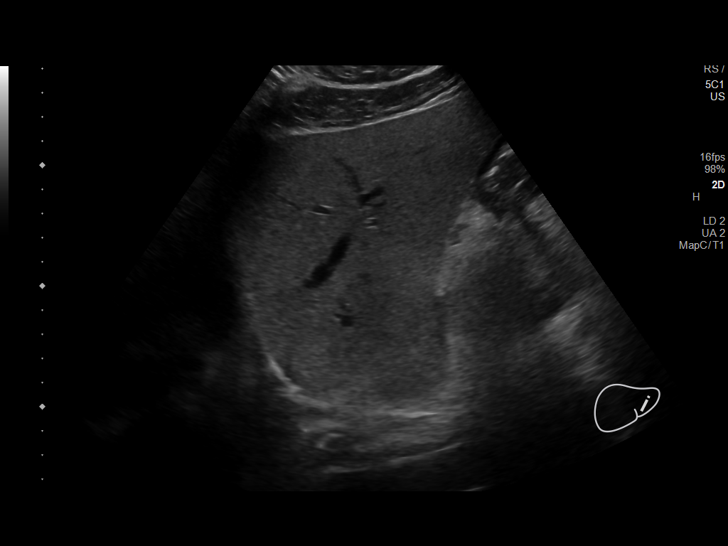
[im 12/47]
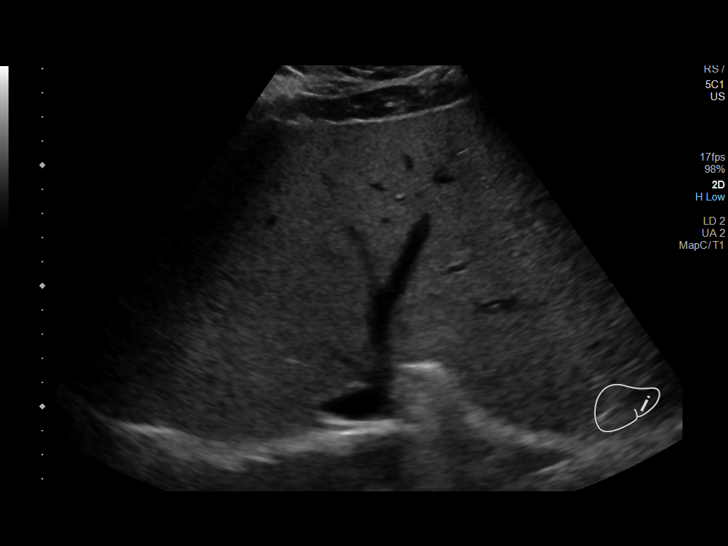
[im 16/47]
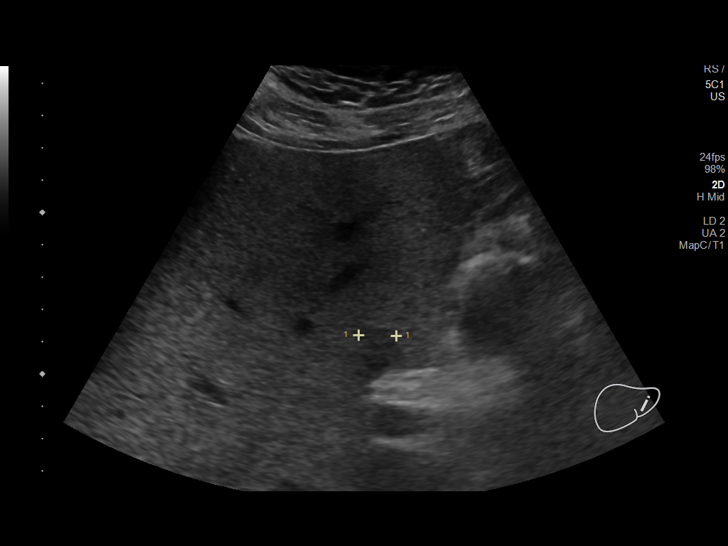
[im 18/47]
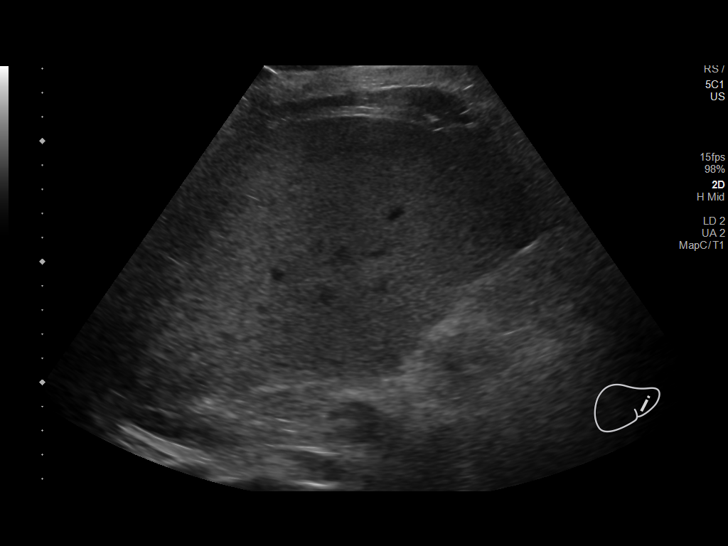
[im 22/47]
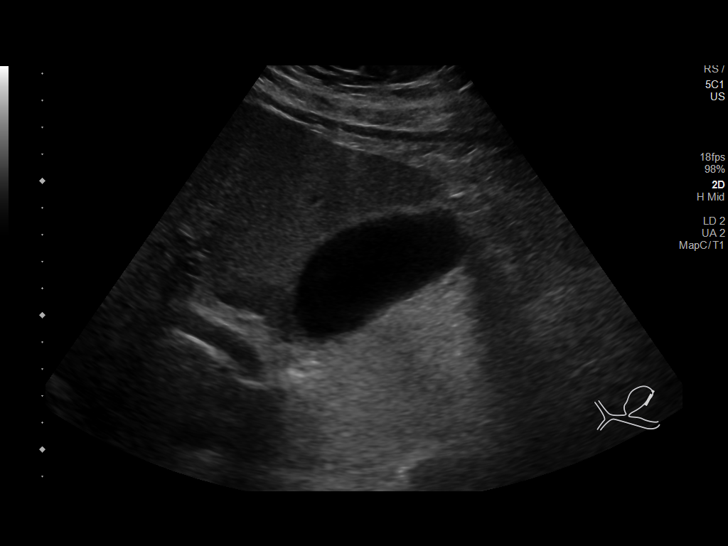
[im 25/47]
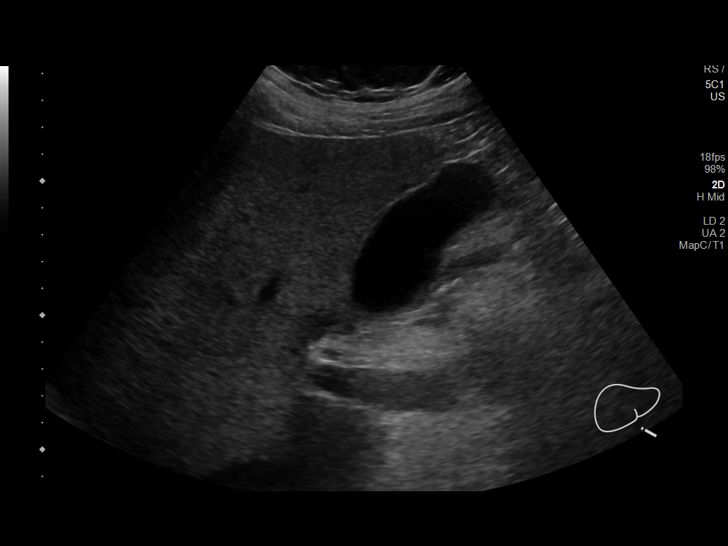
[im 29/47]
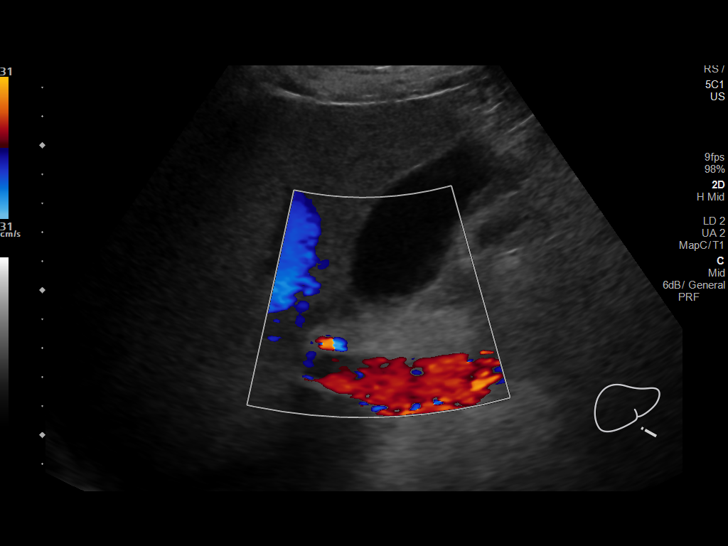
[im 31/47]
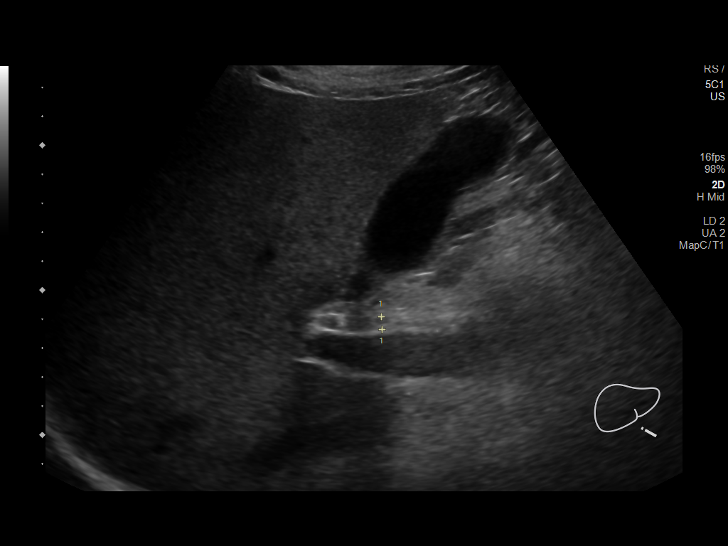
[im 35/47]
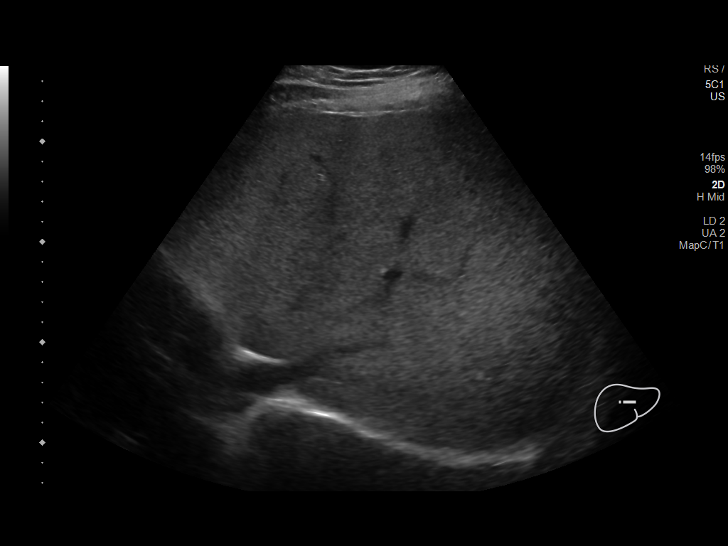
[im 39/47]
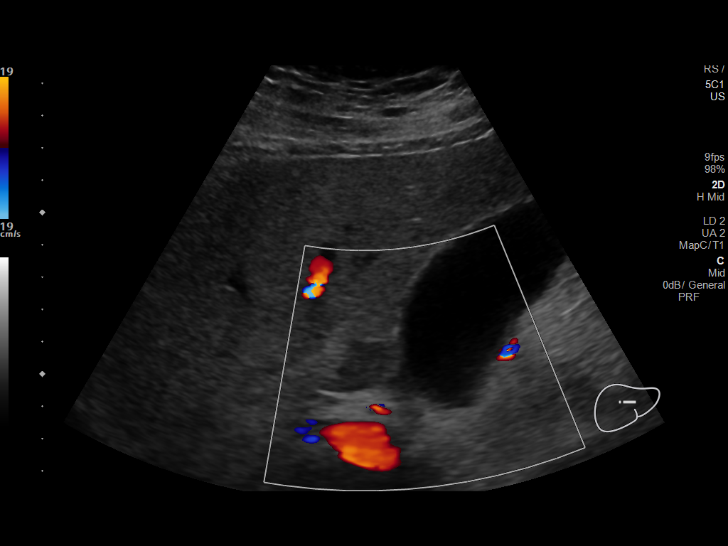
[im 43/47]
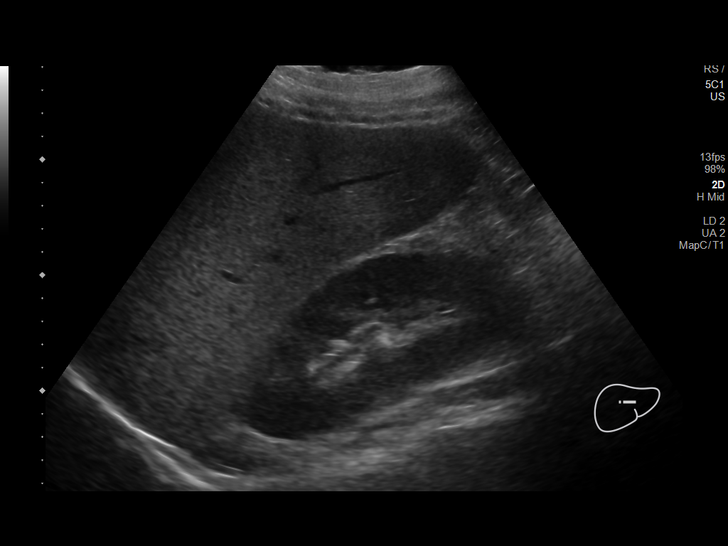
[im 47/47]
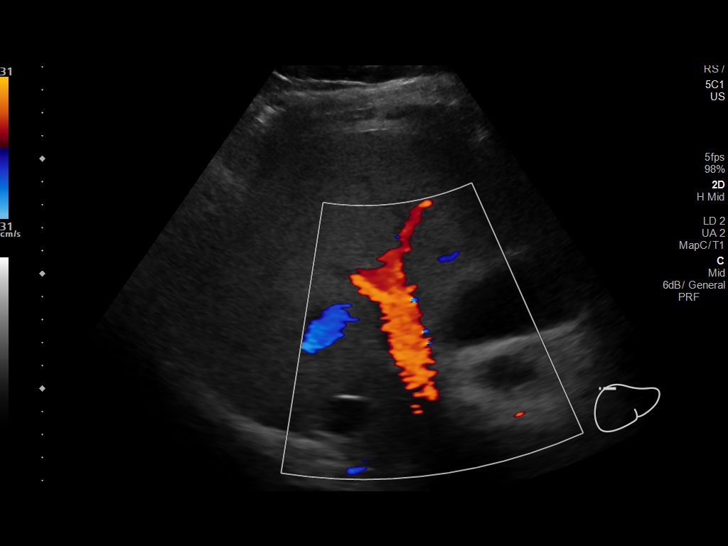

[14 of 25 positions shown; findings below may reference images not displayed]

FINDINGS: Gallbladder:

No gallstones or wall thickening visualized. No sonographic Murphy
sign noted by sonographer.

Common bile duct:

Diameter: 4.4 mm.

Liver:

Increased echogenicity is noted. There is an area of decreased
echogenicity near the gallbladder fossa consistent with focal fatty
sparing. Portal vein is patent on color Doppler imaging with normal
direction of blood flow towards the liver.

Other: None.
IMPRESSION: Fatty infiltration of the liver as described.
